# Patient Record
Sex: Female | Born: 1980 | Race: White | Hispanic: No | Marital: Single | Smoking: Current every day smoker
Health system: Southern US, Community
[De-identification: ages and names within clinical notes are randomized; demographics above are authoritative.]

## PROBLEM LIST (undated history)

## (undated) DIAGNOSIS — E282 Polycystic ovarian syndrome: Secondary | ICD-10-CM

---

## 2000-06-29 ENCOUNTER — Emergency Department (HOSPITAL_COMMUNITY): Admission: EM | Admit: 2000-06-29 | Discharge: 2000-06-29 | Payer: Self-pay | Admitting: Emergency Medicine

## 2002-01-02 ENCOUNTER — Inpatient Hospital Stay (HOSPITAL_COMMUNITY): Admission: AD | Admit: 2002-01-02 | Discharge: 2002-01-02 | Payer: Self-pay | Admitting: Obstetrics and Gynecology

## 2002-08-07 ENCOUNTER — Encounter: Payer: Self-pay | Admitting: Emergency Medicine

## 2002-08-07 ENCOUNTER — Emergency Department (HOSPITAL_COMMUNITY): Admission: EM | Admit: 2002-08-07 | Discharge: 2002-08-07 | Payer: Self-pay | Admitting: Emergency Medicine

## 2003-11-25 ENCOUNTER — Encounter: Admission: RE | Admit: 2003-11-25 | Discharge: 2003-11-25 | Payer: Self-pay | Admitting: Family Medicine

## 2008-11-02 ENCOUNTER — Emergency Department (HOSPITAL_COMMUNITY): Admission: EM | Admit: 2008-11-02 | Discharge: 2008-11-02 | Payer: Self-pay | Admitting: Emergency Medicine

## 2009-09-15 ENCOUNTER — Emergency Department (HOSPITAL_COMMUNITY): Admission: EM | Admit: 2009-09-15 | Discharge: 2009-09-15 | Payer: Self-pay | Admitting: Emergency Medicine

## 2009-11-18 ENCOUNTER — Emergency Department (HOSPITAL_COMMUNITY): Admission: EM | Admit: 2009-11-18 | Discharge: 2009-11-18 | Payer: Self-pay | Admitting: Emergency Medicine

## 2010-05-05 ENCOUNTER — Emergency Department: Payer: Self-pay | Admitting: Emergency Medicine

## 2010-07-19 ENCOUNTER — Emergency Department (HOSPITAL_COMMUNITY)
Admission: EM | Admit: 2010-07-19 | Discharge: 2010-07-19 | Disposition: A | Discharge status: Home / Self Care | Payer: Self-pay | Attending: Emergency Medicine | Admitting: Emergency Medicine

## 2010-07-19 DIAGNOSIS — R05 Cough: Secondary | ICD-10-CM | POA: Insufficient documentation

## 2010-07-19 DIAGNOSIS — R059 Cough, unspecified: Secondary | ICD-10-CM | POA: Insufficient documentation

## 2010-07-19 DIAGNOSIS — J069 Acute upper respiratory infection, unspecified: Secondary | ICD-10-CM | POA: Insufficient documentation

## 2010-07-19 DIAGNOSIS — F17200 Nicotine dependence, unspecified, uncomplicated: Secondary | ICD-10-CM | POA: Insufficient documentation

## 2010-07-29 ENCOUNTER — Emergency Department (HOSPITAL_COMMUNITY)
Admission: EM | Admit: 2010-07-29 | Discharge: 2010-07-29 | Disposition: A | Discharge status: Home / Self Care | Payer: Self-pay | Attending: Emergency Medicine | Admitting: Emergency Medicine

## 2010-07-29 DIAGNOSIS — R059 Cough, unspecified: Secondary | ICD-10-CM | POA: Insufficient documentation

## 2010-07-29 DIAGNOSIS — R05 Cough: Secondary | ICD-10-CM | POA: Insufficient documentation

## 2010-07-29 DIAGNOSIS — R5381 Other malaise: Secondary | ICD-10-CM | POA: Insufficient documentation

## 2010-07-29 DIAGNOSIS — R5383 Other fatigue: Secondary | ICD-10-CM | POA: Insufficient documentation

## 2010-08-15 ENCOUNTER — Emergency Department (HOSPITAL_COMMUNITY)
Admission: EM | Admit: 2010-08-15 | Discharge: 2010-08-16 | Disposition: A | Discharge status: Home / Self Care | Payer: Self-pay | Attending: Emergency Medicine | Admitting: Emergency Medicine

## 2010-08-15 DIAGNOSIS — X58XXXA Exposure to other specified factors, initial encounter: Secondary | ICD-10-CM | POA: Insufficient documentation

## 2010-08-15 DIAGNOSIS — S025XXA Fracture of tooth (traumatic), initial encounter for closed fracture: Secondary | ICD-10-CM | POA: Insufficient documentation

## 2010-08-15 DIAGNOSIS — K089 Disorder of teeth and supporting structures, unspecified: Secondary | ICD-10-CM | POA: Insufficient documentation

## 2010-08-15 DIAGNOSIS — K029 Dental caries, unspecified: Secondary | ICD-10-CM | POA: Insufficient documentation

## 2010-09-23 NOTE — Assessment & Plan Note (Unsigned)
NAMELASONIA, CASINO NO.:  [id number]  MEDICAL RECORD NO.:  [id number]           PATIENT TYPE:  LOCATION:  [facility] at [facility]           FACILITY:  PHYSICIAN:  Allie Bossier, MD        DATE OF BIRTH:  1981-05-08  DATE OF SERVICE:  08/24/2010                                 CLINIC NOTE  The patient comes to the office today for a new headache consultation. The patient has been seen at [facility].  She was diagnosed with migraine this past August 2011, though she does remember having headaches in middle school as well as high school.  She does not have aura with her headaches.  She did also complain of some pain in her neck and shoulder and some tingling in her arms.  She has had an MRI and CT of her head and neck, which were negative.  She has approximately four severe headaches per month lasting up to 2 days each, 10-12 moderate headaches per month and 5-6 mild headaches.  She is having approximately 6 days per month without headache.  When she was seen at [facility], she was given steroid injection and Imitrex, and she believes a shot for something for pain.  She saw Dr. Tinnie Gens for an unrelated issue and was put on Topamax.  She has been taking Topamax 50 mg in the morning and 50 mg at night up until 1 week ago.  She had multiple side effects from taking the medication and discontinued approximately 1 week ago.  The patient does have a history of anxiety.  No depression.  No insomnia.  SOCIAL HISTORY:  The patient works in [employer].  She has two children, ages 30 and 30 years old.  Triggers are stress, not eating.  BIRTH CONTROL:  She has had an ablation secondary to prolonged bleeding.  PROCEDURES:  The patient has received an occipital nerve block in her left occipital nerve approximately 3 mL total, please see that procedure note.  PHYSICAL EXAMINATION:  GENERAL:  A well-developed, well-nourished 30- year-old Caucasian female  in no acute distress. HEENT:  Head is normocephalic and atraumatic.  Pupils equal and reactive.  The patient does have quite severe tenderness at her left occipital region. CARDIAC:  Regular rate and rhythm.  No murmur was appreciated. LUNGS:  Clear bilaterally. NEUROLOGIC:  The patient is alert and oriented.  She is well coordinated.  She has good muscle tone.  Good sensation.  She has appropriate affect.  ASSESSMENT: 1. Migraine without aura. 2. Occipital neuralgia.  PLAN:  The patient want to have occipital nerve block into her left occipital nerve.  She did get immediate relief from that.  She is asked to ice that and that should help significantly.  We have also had a lengthy discussion concerning Topamax.  She will restart the Topamax at 25 mg 1 tablet at bedtime as she feels the side effects decrease, she will go up to two, and if she feels the side effects decrease, she will go up to three.  She has been given a prescription for Maxalt as well as Phenergan.  We discussed ways in which to use these  medications.  She is asked to add Aleve 2 tablets to her Maxalt when she gets a headache.  We spent a significant amount of time today on educational issues, and the patient has a better understanding of her disease process.  She will return to this office in 3 weeks or sooner as need be.     Remonia Richter, NP   ______________________________ Allie Bossier, MD   LR/MEDQ  D:  08/24/2010  T:  08/25/2010  Job:  [id number]

## 2010-11-12 ENCOUNTER — Emergency Department (HOSPITAL_COMMUNITY)
Admission: EM | Admit: 2010-11-12 | Discharge: 2010-11-12 | Disposition: A | Discharge status: Home / Self Care | Payer: Self-pay | Attending: Emergency Medicine | Admitting: Emergency Medicine

## 2010-11-12 DIAGNOSIS — K047 Periapical abscess without sinus: Secondary | ICD-10-CM | POA: Insufficient documentation

## 2010-11-12 DIAGNOSIS — K089 Disorder of teeth and supporting structures, unspecified: Secondary | ICD-10-CM | POA: Insufficient documentation

## 2010-11-12 DIAGNOSIS — K029 Dental caries, unspecified: Secondary | ICD-10-CM | POA: Insufficient documentation

## 2011-04-08 ENCOUNTER — Emergency Department (HOSPITAL_COMMUNITY)
Admission: EM | Admit: 2011-04-08 | Discharge: 2011-04-08 | Disposition: A | Discharge status: Home / Self Care | Payer: Self-pay | Attending: Emergency Medicine | Admitting: Emergency Medicine

## 2011-04-08 DIAGNOSIS — J329 Chronic sinusitis, unspecified: Secondary | ICD-10-CM | POA: Insufficient documentation

## 2011-05-05 ENCOUNTER — Encounter: Payer: Self-pay | Admitting: *Deleted

## 2011-05-05 ENCOUNTER — Emergency Department (HOSPITAL_COMMUNITY)
Admission: EM | Admit: 2011-05-05 | Discharge: 2011-05-06 | Disposition: A | Discharge status: Home / Self Care | Payer: Self-pay | Attending: Emergency Medicine | Admitting: Emergency Medicine

## 2011-05-05 DIAGNOSIS — K047 Periapical abscess without sinus: Secondary | ICD-10-CM

## 2011-05-05 DIAGNOSIS — Z79899 Other long term (current) drug therapy: Secondary | ICD-10-CM | POA: Insufficient documentation

## 2011-05-05 DIAGNOSIS — J45909 Unspecified asthma, uncomplicated: Secondary | ICD-10-CM | POA: Insufficient documentation

## 2011-05-05 DIAGNOSIS — K0889 Other specified disorders of teeth and supporting structures: Secondary | ICD-10-CM

## 2011-05-05 DIAGNOSIS — F17200 Nicotine dependence, unspecified, uncomplicated: Secondary | ICD-10-CM | POA: Insufficient documentation

## 2011-05-05 DIAGNOSIS — K029 Dental caries, unspecified: Secondary | ICD-10-CM | POA: Insufficient documentation

## 2011-05-05 DIAGNOSIS — E282 Polycystic ovarian syndrome: Secondary | ICD-10-CM | POA: Insufficient documentation

## 2011-05-05 HISTORY — DX: Polycystic ovarian syndrome: E28.2

## 2011-05-05 NOTE — ED Notes (Signed)
Pt in c/o mouth pain x2 weeks, increased in last 2 days

## 2011-05-06 MED ORDER — HYDROCODONE-ACETAMINOPHEN 5-325 MG PO TABS: 1.0000 | ORAL_TABLET | ORAL | Status: AC | PRN

## 2011-05-06 MED ORDER — CLINDAMYCIN HCL 300 MG PO CAPS
300.0000 mg | ORAL_CAPSULE | Freq: Once | ORAL | Status: AC
  Administered 2011-05-06: 300 mg via ORAL
  Filled 2011-05-06: qty 1

## 2011-05-06 MED ORDER — OXYCODONE-ACETAMINOPHEN 5-325 MG PO TABS
1.0000 | ORAL_TABLET | Freq: Once | ORAL | Status: AC
  Administered 2011-05-06: 1 via ORAL
  Filled 2011-05-06: qty 1

## 2011-05-06 MED ORDER — CLINDAMYCIN HCL 150 MG PO CAPS: 300.0000 mg | ORAL_CAPSULE | Freq: Four times a day (QID) | ORAL | Status: AC

## 2011-05-06 NOTE — ED Provider Notes (Signed)
History     CSN: [id number] Arrival date & time: 05/05/2011 10:06 PM   First MD Initiated Contact with Patient 05/06/11 0029      Chief Complaint  Patient presents with  . Dental Pain   HPI  History provided by the patient. Patient presents with complaints of off-and-on bilateral molar dental pains that seemed to increase over the past 2 days. Pt states she has history of poor dental care and problems with teeth off-and-on and knows that she needs to see a dentist but is unable to pay to see one. Patient has no dental insurance. Pain is worse with eating or pressure over the teeth. Pain is sharp and shooting at times. Patient denies swelling of gum or under the tongue. She denies fever, chills, sweats, nausea or vomiting. Patient has no other significant past medical history.    Past Medical History  Diagnosis Date  . Asthma   . Polycystic disease, ovaries     History reviewed. No pertinent past surgical history.  History reviewed. No pertinent family history.  History  Substance Use Topics  . Smoking status: Current Everyday Smoker  . Smokeless tobacco: Not on file  . Alcohol Use: No    OB History    Grav Para Term Preterm Abortions TAB SAB Ect Mult Living                  Review of Systems  Constitutional: Negative for fever and chills.  HENT: Negative for sore throat and trouble swallowing.   Gastrointestinal: Negative for nausea and vomiting.  All other systems reviewed and are negative.    Allergies  Penicillins cross reactors and Septra  Home Medications   Current Outpatient Rx  Name Route Sig Dispense Refill  . METFORMIN HCL 1000 MG PO TABS Oral Take 1,000 mg by mouth 2 (two) times daily with a meal.      . CLINDAMYCIN HCL 150 MG PO CAPS Oral Take 2 capsules (300 mg total) by mouth every 6 (six) hours. 28 capsule 0  . HYDROCODONE-ACETAMINOPHEN 5-325 MG PO TABS Oral Take 1 tablet by mouth every 4 (four) hours as needed for pain. 20 tablet 0    BP  147/94  Pulse 92  Temp(Src) 98.8 F (37.1 C) (Oral)  Resp 20  SpO2 100%  Physical Exam  Nursing note and vitals reviewed. Constitutional: She is oriented to person, place, and time. She appears well-developed and well-nourished. No distress.  HENT:  Head: Normocephalic and atraumatic.  Mouth/Throat: Oropharynx is clear and moist.       Multiple dental caries with dental decay on right upper and lower molars as well as left lower molars.  Decay extends to the gum line for some teeth. Pain with palpation and percussion of molars.  No swelling under tongue or signs concerning for Ludwig's angina.  No TTP over TMJs.  Neck: Normal range of motion. Neck supple.  Cardiovascular: Normal rate.   No murmur heard. Pulmonary/Chest: Effort normal. She has no wheezes. She has no rales.  Lymphadenopathy:    She has no cervical adenopathy.  Neurological: She is alert and oriented to person, place, and time.  Skin: Skin is warm.  Psychiatric: She has a normal mood and affect.    ED Course  Procedures (including critical care time)  Labs Reviewed - No data to display No results found.   1. Pain, dental   2. Infected dental carries       MDM  Patient seen and evaluated.  Patient no acute distress.     Medical screening examination/treatment/procedure(s) were conducted as a shared visit with non-physician practitioner(s) and myself.  I personally evaluated the patient during the encounter. On exam has very poor dentition but no single area to suggest a dental abscess that can be drained at this time. Symptomatic control and dental referral provided.   Angus Seller, PA 05/06/11 1610  Sunnie Nielsen, MD 05/06/11 [phone]

## 2011-05-25 ENCOUNTER — Emergency Department (HOSPITAL_COMMUNITY)
Admission: EM | Admit: 2011-05-25 | Discharge: 2011-05-25 | Disposition: A | Discharge status: Home / Self Care | Payer: Self-pay | Attending: Emergency Medicine | Admitting: Emergency Medicine

## 2011-05-25 ENCOUNTER — Encounter (HOSPITAL_COMMUNITY): Payer: Self-pay | Admitting: Adult Health

## 2011-05-25 DIAGNOSIS — J45909 Unspecified asthma, uncomplicated: Secondary | ICD-10-CM | POA: Insufficient documentation

## 2011-05-25 DIAGNOSIS — H6690 Otitis media, unspecified, unspecified ear: Secondary | ICD-10-CM | POA: Insufficient documentation

## 2011-05-25 DIAGNOSIS — H6693 Otitis media, unspecified, bilateral: Secondary | ICD-10-CM

## 2011-05-25 DIAGNOSIS — R05 Cough: Secondary | ICD-10-CM | POA: Insufficient documentation

## 2011-05-25 DIAGNOSIS — F17200 Nicotine dependence, unspecified, uncomplicated: Secondary | ICD-10-CM | POA: Insufficient documentation

## 2011-05-25 DIAGNOSIS — H9209 Otalgia, unspecified ear: Secondary | ICD-10-CM | POA: Insufficient documentation

## 2011-05-25 DIAGNOSIS — R059 Cough, unspecified: Secondary | ICD-10-CM | POA: Insufficient documentation

## 2011-05-25 MED ORDER — AZITHROMYCIN 250 MG PO TABS: ORAL_TABLET | ORAL | Status: DC

## 2011-05-25 MED ORDER — ALBUTEROL SULFATE HFA 108 (90 BASE) MCG/ACT IN AERS: 1.0000 | INHALATION_SPRAY | Freq: Four times a day (QID) | RESPIRATORY_TRACT | Status: DC | PRN

## 2011-05-25 MED ORDER — ANTIPYRINE-BENZOCAINE 5.4-1.4 % OT SOLN: 3.0000 [drp] | OTIC | Status: AC | PRN

## 2011-05-25 NOTE — ED Notes (Signed)
C/o of fever runny nose, cough, sore throat for one week. Associated with nausea and vomitting.

## 2011-05-25 NOTE — ED Provider Notes (Signed)
History     CSN: [id number] Arrival date & time: 05/25/2011  9:30 AM   First MD Initiated Contact with Patient 05/25/11 0950      No chief complaint on file.   (Consider location/radiation/quality/duration/timing/severity/associated sxs/prior treatment) Patient is a 30 y.o. female presenting with ear pain.  Otalgia This is a new problem. Episode onset: Patient says she's been sick since last week. Initially she had a fever up to 101. She's had cough and rhinorrhea. Yesterday she developed sore throat and this morning has a right earache. 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 There is pain in the right ear. The problem has been gradually worsening. The maximum temperature recorded prior to her arrival was 101 to 101.9 F. The pain is mild. Associated symptoms include sore throat and cough.    Past Medical History  Diagnosis Date  . Asthma   . Polycystic disease, ovaries     History reviewed. No pertinent past surgical history.  History reviewed. No pertinent family history.  History  Substance Use Topics  . Smoking status: Current Everyday Smoker  . Smokeless tobacco: Not on file  . Alcohol Use: No    OB History    Grav Para Term Preterm Abortions TAB SAB Ect Mult Living                  Review of Systems  Constitutional: Positive for fever.  HENT: Positive for ear pain and sore throat.   Respiratory: Positive for cough.   Cardiovascular: Negative.   Gastrointestinal: Negative.   Genitourinary: Negative.   Musculoskeletal: Negative.   Skin: Negative.   Neurological: Negative.   Psychiatric/Behavioral: Negative.     Allergies  Amoxicillin; Penicillins cross reactors; and Septra  Home Medications   Current Outpatient Rx  Name Route Sig Dispense Refill  . METFORMIN HCL 1000 MG PO TABS Oral Take 1,000 mg by mouth 2 (two) times daily with a meal.      . NORGESTIMATE-ETH ESTRADIOL 0.25-35 MG-MCG PO TABS Oral Take 1 tablet by mouth  daily.        BP 109/75  Pulse 93  Temp(Src) 98.3 F (36.8 C) (Oral)  Resp 18  SpO2 97%  Physical Exam  Constitutional: She is oriented to person, place, and time. She appears well-developed and well-nourished. No distress.  HENT:  Head: Normocephalic and atraumatic.  Mouth/Throat: Oropharynx is clear and moist.       Both tympanic membranes are red and retracted.  Eyes: Conjunctivae and EOM are normal. Pupils are equal, round, and reactive to light.  Neck: Normal range of motion. Neck supple.  Cardiovascular: Normal rate, regular rhythm and normal heart sounds.   Pulmonary/Chest: Effort normal. She has wheezes.  Abdominal: Soft. Bowel sounds are normal.  Musculoskeletal: Normal range of motion.  Lymphadenopathy:    She has no cervical adenopathy.  Neurological: She is alert and oriented to person, place, and time.       No sensory or motor deficit  Skin: Skin is warm and dry.  Psychiatric: She has a normal mood and affect. Her behavior is normal.    ED Course  Procedures (including critical care time)  10:07 AM Pt has bilateral otitis media, asthma. Rx Z-Pak, Auralgan ear drops, Albuterol inhaler.   1. Bilateral otitis media   2. Asthma             Carleene Cooper III, MD 05/25/11 1011

## 2011-07-20 ENCOUNTER — Encounter (HOSPITAL_COMMUNITY): Payer: Self-pay | Admitting: *Deleted

## 2011-07-20 ENCOUNTER — Emergency Department (HOSPITAL_COMMUNITY)
Admission: EM | Admit: 2011-07-20 | Discharge: 2011-07-20 | Disposition: A | Discharge status: Home / Self Care | Payer: Self-pay | Attending: Emergency Medicine | Admitting: Emergency Medicine

## 2011-07-20 DIAGNOSIS — Z79899 Other long term (current) drug therapy: Secondary | ICD-10-CM | POA: Insufficient documentation

## 2011-07-20 DIAGNOSIS — K047 Periapical abscess without sinus: Secondary | ICD-10-CM | POA: Insufficient documentation

## 2011-07-20 DIAGNOSIS — J45909 Unspecified asthma, uncomplicated: Secondary | ICD-10-CM | POA: Insufficient documentation

## 2011-07-20 DIAGNOSIS — K029 Dental caries, unspecified: Secondary | ICD-10-CM | POA: Insufficient documentation

## 2011-07-20 MED ORDER — CLINDAMYCIN HCL 150 MG PO CAPS: 150.0000 mg | ORAL_CAPSULE | Freq: Four times a day (QID) | ORAL | Status: AC

## 2011-07-20 MED ORDER — IBUPROFEN 600 MG PO TABS: 600.0000 mg | ORAL_TABLET | Freq: Four times a day (QID) | ORAL | Status: AC | PRN

## 2011-07-20 MED ORDER — CLINDAMYCIN HCL 300 MG PO CAPS
300.0000 mg | ORAL_CAPSULE | Freq: Once | ORAL | Status: AC
  Administered 2011-07-20: 300 mg via ORAL
  Filled 2011-07-20: qty 1

## 2011-07-20 NOTE — ED Provider Notes (Signed)
History     CSN: [id number]  Arrival date & time 07/20/11  9147   First MD Initiated Contact with Patient 07/20/11 1844      Chief Complaint  Patient presents with  . Dental Pain    (Consider location/radiation/quality/duration/timing/severity/associated sxs/prior treatment) HPI  31yo F presents with dental pain.  Sts for the past several month she has been having pain to her left side of mouth.  Pain to her teeth and is on and off.  Pain worsen with eating, chewing.  She is aware that she has bad dentition and has been to ER for same complaints in the past.  She is unable to afford dental care due to lost of job and can't follow up with dentist.  Currently denies fever, n/v/d, neck pain, ear pain, or abd pain.  Denies recent rec drug use.    Past Medical History  Diagnosis Date  . Asthma   . Polycystic disease, ovaries     History reviewed. No pertinent past surgical history.  No family history on file.  History  Substance Use Topics  . Smoking status: Current Everyday Smoker  . Smokeless tobacco: Not on file  . Alcohol Use: No    OB History    Grav Para Term Preterm Abortions TAB SAB Ect Mult Living                  Review of Systems  All other systems reviewed and are negative.    Allergies  Amoxicillin; Penicillins cross reactors; and Septra  Home Medications   Current Outpatient Rx  Name Route Sig Dispense Refill  . ALBUTEROL SULFATE HFA 108 (90 BASE) MCG/ACT IN AERS Inhalation Inhale 1-2 puffs into the lungs every 6 (six) hours as needed for wheezing. 1 Inhaler 0  . AZITHROMYCIN 250 MG PO TABS  Take 2 tablets today, then 1 every day until finished. 6 tablet 0  . VITAMIN B-12 PO Oral Take 1 tablet by mouth daily.      . DROSPIRENONE-ETHINYL ESTRADIOL 3-0.03 MG PO TABS Oral Take 1 tablet by mouth daily. PT TAKES WITH SPRINTEC     . METFORMIN HCL 1000 MG PO TABS Oral Take 1,000 mg by mouth 2 (two) times daily with a meal.      . MULTI-VITAMIN/MINERALS PO  TABS Oral Take 1 tablet by mouth daily.      . SPRINTEC 28 PO Oral Take 1 tablet by mouth daily. PT TAKES WITH SYEDA     . VITAMIN C 500 MG PO TABS Oral Take 500 mg by mouth daily.        BP 149/97  Pulse 92  Temp(Src) 98.5 F (36.9 C) (Oral)  Resp 16  Ht 5\' 8"  (1.727 m)  SpO2 100%  LMP 07/15/2011  Physical Exam  Nursing note and vitals reviewed. Constitutional: She appears well-developed and well-nourished. No distress.  HENT:  Head: Normocephalic and atraumatic.  Right Ear: External ear normal.  Left Ear: External ear normal.  Mouth/Throat:           Multiple dental caries with dental decay on right upper and lower molars as well as left lower molars.  Decay extends to the gum line for some teeth. Pain with palpation and percussion of molars.  No swelling under tongue or signs concerning for Ludwig's angina.  No tenderness to palpation over TMJs.  Cardiovascular: Regular rhythm.   Pulmonary/Chest: Effort normal.  Neurological: She is alert.    ED Course  Procedures (including critical care  time)  Labs Reviewed - No data to display No results found.   No diagnosis found.    MDM  Repeat visits for dental complaints.  Obvious dental decays noted.  Will prescribe clindamycin and f/u with dentist.          Fayrene Helper, PA-C 07/20/11 1918

## 2011-07-20 NOTE — ED Provider Notes (Signed)
Medical screening examination/treatment/procedure(s) were performed by non-physician practitioner and as supervising physician I was immediately available for consultation/collaboration.   Dayton Bailiff, MD 07/20/11 [phone]

## 2011-07-20 NOTE — ED Notes (Signed)
Pt reports pain to left side of mouth x 1 month. Taken ibuprofen at home with some relief. States lost job and unable to see dentist.

## 2011-12-12 ENCOUNTER — Encounter (HOSPITAL_COMMUNITY): Payer: Self-pay | Admitting: *Deleted

## 2011-12-12 ENCOUNTER — Emergency Department (HOSPITAL_COMMUNITY)
Admission: EM | Admit: 2011-12-12 | Discharge: 2011-12-12 | Disposition: A | Discharge status: Home / Self Care | Payer: Self-pay | Attending: Emergency Medicine | Admitting: Emergency Medicine

## 2011-12-12 DIAGNOSIS — Z79899 Other long term (current) drug therapy: Secondary | ICD-10-CM | POA: Insufficient documentation

## 2011-12-12 DIAGNOSIS — R0982 Postnasal drip: Secondary | ICD-10-CM

## 2011-12-12 DIAGNOSIS — J069 Acute upper respiratory infection, unspecified: Secondary | ICD-10-CM | POA: Insufficient documentation

## 2011-12-12 DIAGNOSIS — J45909 Unspecified asthma, uncomplicated: Secondary | ICD-10-CM | POA: Insufficient documentation

## 2011-12-12 LAB — RAPID STREP SCREEN (MED CTR MEBANE ONLY): Streptococcus, Group A Screen (Direct): NEGATIVE

## 2011-12-12 MED ORDER — BENZONATATE 100 MG PO CAPS: 100.0000 mg | ORAL_CAPSULE | Freq: Three times a day (TID) | ORAL | Status: AC

## 2011-12-12 MED ORDER — FLUTICASONE PROPIONATE 50 MCG/ACT NA SUSP: 2.0000 | Freq: Every day | NASAL | Status: DC

## 2011-12-12 NOTE — ED Provider Notes (Signed)
History     CSN: [id number]  Arrival date & time 12/12/11  1947   First MD Initiated Contact with Patient 12/12/11 2103      Chief Complaint  Patient presents with  . Sore Throat    (Consider location/radiation/quality/duration/timing/severity/associated sxs/prior treatment) Patient is a 31 y.o. female presenting with cough. The history is provided by the patient.  Cough The current episode started more than 2 days ago. The problem occurs constantly. The problem has not changed since onset.There has been no fever. Pertinent negatives include no shortness of breath and no wheezing.   31 y/o female iNAD c/o runny nose,positional cough and "itching" throat with bilateral ear pressure x5 days. Pt is coughing up phlegm much worse in AM. Denies fever, SOB, N/V or any GI complaints.    Past Medical History  Diagnosis Date  . Asthma   . Polycystic disease, ovaries     History reviewed. No pertinent past surgical history.  No family history on file.  History  Substance Use Topics  . Smoking status: Current Everyday Smoker  . Smokeless tobacco: Not on file  . Alcohol Use: No    OB History    Grav Para Term Preterm Abortions TAB SAB Ect Mult Living                  Review of Systems  Constitutional: Positive for fatigue. Negative for fever.  Respiratory: Positive for cough. Negative for shortness of breath, wheezing and stridor.   Gastrointestinal: Negative for nausea, vomiting, diarrhea and constipation.  All other systems reviewed and are negative.    Allergies  Amoxicillin; Penicillins cross reactors; and Septra  Home Medications   Current Outpatient Rx  Name Route Sig Dispense Refill  . ALBUTEROL SULFATE HFA 108 (90 BASE) MCG/ACT IN AERS Inhalation Inhale 1-2 puffs into the lungs every 6 (six) hours as needed for wheezing. 1 Inhaler 0  . DROSPIRENONE-ETHINYL ESTRADIOL 3-0.03 MG PO TABS Oral Take 1 tablet by mouth daily. PT TAKES WITH SPRINTEC    . GUAIFENESIN ER  600 MG PO TB12 Oral Take 600 mg by mouth 2 (two) times daily as needed. For cough.    . METFORMIN HCL 1000 MG PO TABS Oral Take 1,000 mg by mouth daily.     . MULTI-VITAMIN/MINERALS PO TABS Oral Take 1 tablet by mouth daily.      . SPRINTEC 28 PO Oral Take 1 tablet by mouth daily. PT TAKES WITH SYEDA     . CYANOCOBALAMIN 250 MCG PO TABS Oral Take 500 mcg by mouth daily.    Marland Kitchen VITAMIN C 500 MG PO TABS Oral Take 500 mg by mouth daily.        BP 128/81  Pulse 83  Temp 98.9 F (37.2 C) (Oral)  Resp 24  SpO2 95%  LMP 12/11/2011  Physical Exam  Nursing note and vitals reviewed. Constitutional: She is oriented to person, place, and time. She appears well-developed and well-nourished. No distress.       Obese   HENT:  Head: Normocephalic and atraumatic.  Right Ear: External ear normal.  Left Ear: External ear normal.  Nose: Nose normal.  Mouth/Throat: Oropharynx is clear and moist. No oropharyngeal exudate.       Posterior pharynx injected   Eyes: Conjunctivae and EOM are normal. Pupils are equal, round, and reactive to light.  Neck: Normal range of motion.       Mild shotty AC LAD  Cardiovascular: Normal rate.   Pulmonary/Chest: Effort normal  and breath sounds normal. No respiratory distress. She has no wheezes. She has no rales.  Abdominal: Soft. Bowel sounds are normal.  Musculoskeletal: Normal range of motion.  Neurological: She is alert and oriented to person, place, and time.  Psychiatric: She has a normal mood and affect.    ED Course  Procedures (including critical care time)   Labs Reviewed  RAPID STREP SCREEN   No results found.   1. URI (upper respiratory infection)   2. Post-nasal drip       MDM  31 y/o with s/s consistent with URI with Eustation tube dysfunction and post nasal drip. Advised Pt antibiotics will not help her to get will faster. Rx fluticasone with nasal saline OTC and tessalon cough Rx.         Wynetta Emery, PA-C 12/12/11 2213

## 2011-12-12 NOTE — ED Notes (Signed)
Pt c/o cough; yellow drainage; bilateral ear pain; sore throat; symptoms x 5 days

## 2011-12-12 NOTE — Discharge Instructions (Signed)
Use nasal saline at least 4 times a day, use saline 5-10 minutes before using the fluticasone (flonase)  Viral Infections A virus is a type of germ. Viruses can cause:  Minor sore throats.   Aches and pains.   Headaches.   Runny nose.   Rashes.   Watery eyes.   Tiredness.   Coughs.   Loss of appetite.   Feeling sick to your stomach (nausea).   Throwing up (vomiting).   Watery poop (diarrhea).  HOME CARE   Only take medicines as told by your doctor.   Drink enough water and fluids to keep your pee (urine) clear or pale yellow. Sports drinks are a good choice.   Get plenty of rest and eat healthy. Soups and broths with crackers or rice are fine.  GET HELP RIGHT AWAY IF:   You have a very bad headache.   You have shortness of breath.   You have chest pain or neck pain.   You have an unusual rash.   You cannot stop throwing up.   You have watery poop that does not stop.   You cannot keep fluids down.   You or your child has a temperature by mouth above 102 F (38.9 C), not controlled by medicine.   Your baby is older than 3 months with a rectal temperature of 102 F (38.9 C) or higher.   Your baby is 81 months old or younger with a rectal temperature of 100.4 F (38 C) or higher.  MAKE SURE YOU:   Understand these instructions.   Will watch this condition.   Will get help right away if you are not doing well or get worse.  Document Released: 05/12/2008 Document Revised: 05/19/2011 Document Reviewed: 10/05/2010 [facility] Patient Information 2012 [place], [place].

## 2011-12-13 LAB — STREP A DNA PROBE: Group A Strep Probe: NEGATIVE

## 2011-12-14 NOTE — ED Provider Notes (Signed)
Medical screening examination/treatment/procedure(s) were performed by non-physician practitioner and as supervising physician I was immediately available for consultation/collaboration.   Aisa Schoeppner E Lynore Coscia, MD 12/14/11 0806 

## 2011-12-20 ENCOUNTER — Encounter (HOSPITAL_COMMUNITY): Payer: Self-pay | Admitting: Cardiology

## 2011-12-20 ENCOUNTER — Emergency Department (HOSPITAL_COMMUNITY)
Admission: EM | Admit: 2011-12-20 | Discharge: 2011-12-20 | Disposition: A | Discharge status: Home / Self Care | Payer: Self-pay | Attending: Emergency Medicine | Admitting: Emergency Medicine

## 2011-12-20 DIAGNOSIS — J45909 Unspecified asthma, uncomplicated: Secondary | ICD-10-CM | POA: Insufficient documentation

## 2011-12-20 DIAGNOSIS — K089 Disorder of teeth and supporting structures, unspecified: Secondary | ICD-10-CM | POA: Insufficient documentation

## 2011-12-20 DIAGNOSIS — K0889 Other specified disorders of teeth and supporting structures: Secondary | ICD-10-CM

## 2011-12-20 MED ORDER — CLINDAMYCIN HCL 300 MG PO CAPS
300.0000 mg | ORAL_CAPSULE | Freq: Once | ORAL | Status: AC
  Administered 2011-12-20: 300 mg via ORAL
  Filled 2011-12-20 (×2): qty 1

## 2011-12-20 MED ORDER — HYDROCODONE-ACETAMINOPHEN 5-325 MG PO TABS
1.0000 | ORAL_TABLET | Freq: Once | ORAL | Status: AC
  Administered 2011-12-20: 1 via ORAL
  Filled 2011-12-20: qty 1

## 2011-12-20 MED ORDER — HYDROCODONE-ACETAMINOPHEN 5-325 MG PO TABS: 1.0000 | ORAL_TABLET | ORAL | Status: AC | PRN

## 2011-12-20 MED ORDER — CLINDAMYCIN HCL 150 MG PO CAPS: 300.0000 mg | ORAL_CAPSULE | Freq: Three times a day (TID) | ORAL | Status: AC

## 2011-12-20 NOTE — ED Provider Notes (Signed)
History     CSN: [id number]  Arrival date & time 12/20/11  4098   First MD Initiated Contact with Patient 12/20/11 0848      Chief Complaint  Patient presents with  . Dental Pain    (Consider location/radiation/quality/duration/timing/severity/associated sxs/prior treatment) Patient is a 31 y.o. female presenting with tooth pain. The history is provided by the patient.  Dental PainThe primary symptoms include mouth pain. Primary symptoms do not include fever. The symptoms began yesterday. The symptoms are worsening. The symptoms occur constantly.  Additional symptoms do not include: ear pain. Associated symptoms comments: Pain in upper left molar since last night, without injury or known acute fracture. No facial swelling or fever. "I know the tooth is bad". .    Past Medical History  Diagnosis Date  . Asthma   . Polycystic disease, ovaries     History reviewed. No pertinent past surgical history.  History reviewed. No pertinent family history.  History  Substance Use Topics  . Smoking status: Current Everyday Smoker  . Smokeless tobacco: Not on file  . Alcohol Use: No    OB History    Grav Para Term Preterm Abortions TAB SAB Ect Mult Living                  Review of Systems  Constitutional: Negative for fever.  HENT: Positive for dental problem. Negative for ear pain and neck pain.     Allergies  Amoxicillin; Penicillins cross reactors; and Septra  Home Medications   Current Outpatient Rx  Name Route Sig Dispense Refill  . ALBUTEROL SULFATE HFA 108 (90 BASE) MCG/ACT IN AERS Inhalation Inhale 1-2 puffs into the lungs every 6 (six) hours as needed for wheezing. 1 Inhaler 0  . DROSPIRENONE-ETHINYL ESTRADIOL 3-0.03 MG PO TABS Oral Take 1 tablet by mouth daily. PT TAKES WITH SPRINTEC    . FLUTICASONE PROPIONATE 50 MCG/ACT NA SUSP Nasal Place 2 sprays into the nose daily. 16 g 2  . METFORMIN HCL 1000 MG PO TABS Oral Take 1,000 mg by mouth daily.     . SPRINTEC 28  PO Oral Take 1 tablet by mouth daily. PT TAKES WITH SYEDA     . BENZONATATE 100 MG PO CAPS Oral Take 1 capsule (100 mg total) by mouth every 8 (eight) hours. 21 capsule 0    BP 142/86  Pulse 74  Temp 98.2 F (36.8 C) (Oral)  Resp 16  SpO2 100%  LMP 12/11/2011  Physical Exam  Constitutional: She appears well-developed and well-nourished. No distress.  HENT:  Head: Normocephalic.       Generally good dentition. Upper left 1st molar has moderate cavity without surrounding gingival swelling. No facial swelling or local lymphadenopathy.  Neck: Normal range of motion.  Pulmonary/Chest: Effort normal.  Lymphadenopathy:    She has no cervical adenopathy.    ED Course  Procedures (including critical care time)  Labs Reviewed - No data to display No results found.   No diagnosis found.  1. Dental pain   MDM  Cavity that is acutely symptomatic. Will cover with abx and give pain medication. Encourage dental follow up.        Rodena Medin, PA-C 12/20/11 [phone]

## 2011-12-20 NOTE — ED Notes (Signed)
Pt c/o tooth pain to the left side of her face. Reports that the pain started last night.

## 2011-12-21 NOTE — ED Provider Notes (Signed)
Medical screening examination/treatment/procedure(s) were performed by non-physician practitioner and as supervising physician I was immediately available for consultation/collaboration.   Andersen Mckiver, MD 12/21/11 2350 

## 2012-06-19 ENCOUNTER — Emergency Department: Payer: Self-pay | Admitting: Emergency Medicine

## 2012-09-12 ENCOUNTER — Encounter (HOSPITAL_COMMUNITY): Payer: Self-pay

## 2012-09-12 ENCOUNTER — Emergency Department (HOSPITAL_COMMUNITY)
Admission: EM | Admit: 2012-09-12 | Discharge: 2012-09-13 | Disposition: A | Discharge status: Home / Self Care | Payer: Self-pay | Attending: Emergency Medicine | Admitting: Emergency Medicine

## 2012-09-12 DIAGNOSIS — Z79899 Other long term (current) drug therapy: Secondary | ICD-10-CM | POA: Insufficient documentation

## 2012-09-12 DIAGNOSIS — N949 Unspecified condition associated with female genital organs and menstrual cycle: Secondary | ICD-10-CM | POA: Insufficient documentation

## 2012-09-12 DIAGNOSIS — E282 Polycystic ovarian syndrome: Secondary | ICD-10-CM | POA: Insufficient documentation

## 2012-09-12 DIAGNOSIS — Z8742 Personal history of other diseases of the female genital tract: Secondary | ICD-10-CM | POA: Insufficient documentation

## 2012-09-12 DIAGNOSIS — N938 Other specified abnormal uterine and vaginal bleeding: Secondary | ICD-10-CM | POA: Insufficient documentation

## 2012-09-12 DIAGNOSIS — Z3202 Encounter for pregnancy test, result negative: Secondary | ICD-10-CM | POA: Insufficient documentation

## 2012-09-12 DIAGNOSIS — J45909 Unspecified asthma, uncomplicated: Secondary | ICD-10-CM | POA: Insufficient documentation

## 2012-09-12 DIAGNOSIS — N898 Other specified noninflammatory disorders of vagina: Secondary | ICD-10-CM | POA: Insufficient documentation

## 2012-09-12 DIAGNOSIS — F17200 Nicotine dependence, unspecified, uncomplicated: Secondary | ICD-10-CM | POA: Insufficient documentation

## 2012-09-12 LAB — URINALYSIS, ROUTINE W REFLEX MICROSCOPIC
Bilirubin Urine: NEGATIVE
Ketones, ur: NEGATIVE mg/dL
Nitrite: NEGATIVE
Protein, ur: NEGATIVE mg/dL
Specific Gravity, Urine: 1.019 (ref 1.005–1.030)
Urobilinogen, UA: 0.2 mg/dL (ref 0.0–1.0)

## 2012-09-12 LAB — URINE MICROSCOPIC-ADD ON

## 2012-09-12 NOTE — ED Notes (Signed)
Pt states that she's been spotting, brown clotting, for one week, she has another week before her menstrual cycle

## 2012-09-12 NOTE — ED Provider Notes (Signed)
History     CSN: [id number]  Arrival date & time 09/12/12  2059   First MD Initiated Contact with Patient 09/12/12 2244      Chief Complaint  Patient presents with  . Vaginal Bleeding    (Consider location/radiation/quality/duration/timing/severity/associated sxs/prior treatment) HPI Comments: Patient presents emergency department with chief complaint of vaginal bleeding. She states that she is currently taking the pill, and is not do to have her cycle for another week. She states that she has been having mild spotting, with brown spotting for one week. She has been using pads, but has not been filling multiple pads.  She states that she has not had a lot of discharge. She denies having any pain. Nothing makes her symptoms better or worse.  The history is provided by the patient. No language interpreter was used.    Past Medical History  Diagnosis Date  . Asthma   . Polycystic disease, ovaries     History reviewed. No pertinent past surgical history.  History reviewed. No pertinent family history.  History  Substance Use Topics  . Smoking status: Current Every Day Smoker  . Smokeless tobacco: Not on file  . Alcohol Use: No    OB History   Grav Para Term Preterm Abortions TAB SAB Ect Mult Living                  Review of Systems  All other systems reviewed and are negative.    Allergies  Amoxicillin; Penicillins cross reactors; and Septra  Home Medications   Current Outpatient Rx  Name  Route  Sig  Dispense  Refill  . Biotin 2.5 MG CAPS   Oral   Take 2.5 mg by mouth every evening.         . loratadine (CLARITIN) 10 MG tablet   Oral   Take 10 mg by mouth every evening.         . metFORMIN (GLUCOPHAGE) 1000 MG tablet   Oral   Take 1,000 mg by mouth every evening.          Suzzanne Cloud Estradiol (SPRINTEC 28 PO)   Oral   Take 1 tablet by mouth daily.          Marland Kitchen spironolactone (ALDACTONE) 100 MG tablet   Oral   Take 100 mg by mouth  every evening.            BP 134/92  Pulse 90  Temp(Src) 98.2 F (36.8 C) (Oral)  Resp 18  SpO2 100%  LMP 08/24/2012  Physical Exam  Nursing note and vitals reviewed. Constitutional: She is oriented to person, place, and time. She appears well-developed and well-nourished.  HENT:  Head: Normocephalic and atraumatic.  Eyes: Conjunctivae and EOM are normal.  Neck: Normal range of motion. Neck supple.  Cardiovascular: Normal rate and regular rhythm.  Exam reveals no gallop and no friction rub.   No murmur heard. Pulmonary/Chest: Effort normal and breath sounds normal. No respiratory distress. She has no wheezes. She has no rales. She exhibits no tenderness.  Abdominal: Soft. Bowel sounds are normal. She exhibits no distension and no mass. There is no tenderness. There is no rebound and no guarding.  Genitourinary: No labial fusion. There is no rash, tenderness, lesion or injury on the right labia. There is no rash, tenderness, lesion or injury on the left labia. Cervix exhibits discharge. Cervix exhibits no motion tenderness and no friability. Right adnexum displays no mass, no tenderness and no fullness. Left adnexum  displays no mass, no tenderness and no fullness. No erythema, tenderness or bleeding around the vagina. No foreign body around the vagina. No signs of injury around the vagina. No vaginal discharge found.  Very mild red/brown discharge from cervix, no active hemorrhage.  Musculoskeletal: Normal range of motion. She exhibits no edema and no tenderness.  Neurological: She is alert and oriented to person, place, and time.  Skin: Skin is warm and dry.  Psychiatric: She has a normal mood and affect. Her behavior is normal. Judgment and thought content normal.    ED Course  Procedures (including critical care time)  Labs Reviewed  URINALYSIS, ROUTINE W REFLEX MICROSCOPIC - Abnormal; Notable for the following:    Hgb urine dipstick MODERATE (*)    All other components  within normal limits  WET PREP, GENITAL  GC/CHLAMYDIA PROBE AMP  PREGNANCY, URINE  URINE MICROSCOPIC-ADD ON   Results for orders placed during the hospital encounter of 09/12/12  WET PREP, GENITAL      Result Value Range   Yeast Wet Prep HPF POC NONE SEEN  NONE SEEN   Trich, Wet Prep NONE SEEN  NONE SEEN   Clue Cells Wet Prep HPF POC NONE SEEN  NONE SEEN   WBC, Wet Prep HPF POC FEW (*) NONE SEEN  URINALYSIS, ROUTINE W REFLEX MICROSCOPIC      Result Value Range   Color, Urine YELLOW  YELLOW   APPearance CLEAR  CLEAR   Specific Gravity, Urine 1.019  1.005 - 1.030   pH 5.5  5.0 - 8.0   Glucose, UA NEGATIVE  NEGATIVE mg/dL   Hgb urine dipstick MODERATE (*) NEGATIVE   Bilirubin Urine NEGATIVE  NEGATIVE   Ketones, ur NEGATIVE  NEGATIVE mg/dL   Protein, ur NEGATIVE  NEGATIVE mg/dL   Urobilinogen, UA 0.2  0.0 - 1.0 mg/dL   Nitrite NEGATIVE  NEGATIVE   Leukocytes, UA NEGATIVE  NEGATIVE  PREGNANCY, URINE      Result Value Range   Preg Test, Ur NEGATIVE  NEGATIVE  URINE MICROSCOPIC-ADD ON      Result Value Range   Squamous Epithelial / LPF RARE  RARE   WBC, UA 0-2  <3 WBC/hpf   RBC / HPF 0-2  <3 RBC/hpf   Urine-Other RARE YEAST    POCT PREGNANCY, URINE      Result Value Range   Preg Test, Ur NEGATIVE  NEGATIVE  POCT I-STAT, CHEM 8      Result Value Range   Sodium 140  135 - 145 mEq/L   Potassium 4.0  3.5 - 5.1 mEq/L   Chloride 107  96 - 112 mEq/L   BUN 11  6 - 23 mg/dL   Creatinine, Ser 4.09  0.50 - 1.10 mg/dL   Glucose, Bld 92  70 - 99 mg/dL   Calcium, Ion 8.11  9.14 - 1.23 mmol/L   TCO2 25  0 - 100 mmol/L   Hemoglobin 14.6  12.0 - 15.0 g/dL   HCT 78.2  95.6 - 21.3 %       1. Dysfunctional uterine bleeding       MDM  Patient with abnormal uterine bleeding. There is no hemorrhaging. Her H&H are stable. She is not dizzy or lightheaded. She is not pregnant. Patient may be discharged, and followup with OB/GYN. She is stable and ready for  discharge.        Roxy Horseman, PA-C 09/13/12 [phone]

## 2012-09-13 LAB — POCT I-STAT, CHEM 8
Calcium, Ion: 1.18 mmol/L (ref 1.12–1.23)
Creatinine, Ser: 0.8 mg/dL (ref 0.50–1.10)
Glucose, Bld: 92 mg/dL (ref 70–99)
Hemoglobin: 14.6 g/dL (ref 12.0–15.0)
TCO2: 25 mmol/L (ref 0–100)

## 2012-09-13 LAB — WET PREP, GENITAL: Yeast Wet Prep HPF POC: NONE SEEN

## 2012-09-13 LAB — GC/CHLAMYDIA PROBE AMP
CT Probe RNA: NEGATIVE
GC Probe RNA: NEGATIVE

## 2012-09-13 NOTE — ED Provider Notes (Signed)
Medical screening examination/treatment/procedure(s) were performed by non-physician practitioner and as supervising physician I was immediately available for consultation/collaboration.  Lyanne Co, MD 09/13/12 0730

## 2014-02-14 ENCOUNTER — Emergency Department: Payer: Self-pay | Admitting: Emergency Medicine

## 2014-02-16 ENCOUNTER — Emergency Department: Payer: Self-pay | Admitting: Emergency Medicine

## 2014-04-07 ENCOUNTER — Emergency Department (HOSPITAL_COMMUNITY)
Admission: EM | Admit: 2014-04-07 | Discharge: 2014-04-07 | Disposition: A | Discharge status: Home / Self Care | Payer: Self-pay | Attending: Emergency Medicine | Admitting: Emergency Medicine

## 2014-04-07 ENCOUNTER — Encounter (HOSPITAL_COMMUNITY): Payer: Self-pay | Admitting: Emergency Medicine

## 2014-04-07 DIAGNOSIS — J45909 Unspecified asthma, uncomplicated: Secondary | ICD-10-CM | POA: Insufficient documentation

## 2014-04-07 DIAGNOSIS — Z720 Tobacco use: Secondary | ICD-10-CM | POA: Insufficient documentation

## 2014-04-07 DIAGNOSIS — Z79899 Other long term (current) drug therapy: Secondary | ICD-10-CM | POA: Insufficient documentation

## 2014-04-07 DIAGNOSIS — K029 Dental caries, unspecified: Secondary | ICD-10-CM | POA: Insufficient documentation

## 2014-04-07 DIAGNOSIS — Z8639 Personal history of other endocrine, nutritional and metabolic disease: Secondary | ICD-10-CM | POA: Insufficient documentation

## 2014-04-07 DIAGNOSIS — Z880 Allergy status to penicillin: Secondary | ICD-10-CM | POA: Insufficient documentation

## 2014-04-07 DIAGNOSIS — K0889 Other specified disorders of teeth and supporting structures: Secondary | ICD-10-CM

## 2014-04-07 DIAGNOSIS — K088 Other specified disorders of teeth and supporting structures: Secondary | ICD-10-CM | POA: Insufficient documentation

## 2014-04-07 MED ORDER — HYDROCODONE-ACETAMINOPHEN 5-325 MG PO TABS
1.0000 | ORAL_TABLET | Freq: Once | ORAL | Status: AC
  Administered 2014-04-07: 1 via ORAL
  Filled 2014-04-07: qty 1

## 2014-04-07 MED ORDER — NAPROXEN 500 MG PO TABS: 500.0000 mg | ORAL_TABLET | Freq: Two times a day (BID) | ORAL | Status: DC

## 2014-04-07 MED ORDER — HYDROCODONE-ACETAMINOPHEN 5-325 MG PO TABS: ORAL_TABLET | ORAL | Status: DC

## 2014-04-07 MED ORDER — CLINDAMYCIN HCL 150 MG PO CAPS: 300.0000 mg | ORAL_CAPSULE | Freq: Four times a day (QID) | ORAL | Status: DC

## 2014-04-07 NOTE — Discharge Instructions (Signed)
Please read and follow all provided instructions.  Your diagnoses today include:  1. Pain, dental    The exam and treatment you received today has been provided on an emergency basis only. This is not a substitute for complete medical or dental care.  Tests performed today include:  Vital signs. See below for your results today.   Medications prescribed:   Clindamycin - antibiotic  You have been prescribed an antibiotic medicine: take the entire course of medicine even if you are feeling better. Stopping early can cause the antibiotic not to work.   Vicodin (hydrocodone/acetaminophen) - narcotic pain medication  DO NOT drive or perform any activities that require you to be awake and alert because this medicine can make you drowsy. BE VERY CAREFUL not to take multiple medicines containing Tylenol (also called acetaminophen). Doing so can lead to an overdose which can damage your liver and cause liver failure and possibly death.   Naproxen - anti-inflammatory pain medication  Do not exceed 500mg  naproxen every 12 hours, take with food  You have been prescribed an anti-inflammatory medication or NSAID. Take with food. Take smallest effective dose for the shortest duration needed for your pain. Stop taking if you experience stomach pain or vomiting.   Take any prescribed medications only as directed.  Home care instructions:  Follow any educational materials contained in this packet.  Follow-up instructions: Please follow-up with your dentist for further evaluation of your symptoms.   Dental Assistance: See below for dental referrals  Return instructions:   Please return to the Emergency Department if you experience worsening symptoms.  Please return if you develop a fever, you develop more swelling in your face or neck, you have trouble breathing or swallowing food.  Please return if you have any other emergent concerns.  Additional Information:  Your vital signs today  were: BP 142/82   Pulse 77   Temp(Src) 98.2 F (36.8 C) (Oral)   SpO2 95%   LMP 03/13/2014 If your blood pressure (BP) was elevated above 135/85 this visit, please have this repeated by your doctor within one month. -------------- Dental Care: Organization         Address  Phone  Notes  [facility] [address], [place] [phone] Accepts children up to age 33 who are enrolled in [place] or [place] Health Choice; pregnant women with a Medicaid card; and children who have applied for Medicaid or [place] Health Choice, but were declined, whose parents can pay a reduced fee at time of service.  [facility] [place]  [address], [place] [phone] Accepts children up to age 33 who are enrolled in [place] or [place] Health Choice; pregnant women with a Medicaid card; and children who have applied for Medicaid or [place] Health Choice, but were declined, whose parents can pay a reduced fee at time of service.  [facility]  [address], [place] [phone] Patients are seen by appointment only. Walk-ins are not accepted. [facility] will see patients 33 years of age and older. Monday - Tuesday (8am-5pm) Most Wednesdays (8:30-5pm) $30 per visit, cash only  [facility]  [address], [place] [phone] Patients are seen by appointment only. Walk-ins are not accepted. [facility] will see patients 33 years of age and older. One Wednesday Evening (Monthly: Volunteer Based).  $30 per visit, cash only  [facility]  [phone] for adults; Children under age 194, call Graduate Pediatric Dentistry at [phone]. Children aged 854-14, please call [phone] to request a pediatric application.  Dental services are provided in all areas of dental care including fillings, crowns and bridges, complete and  partial dentures, implants, gum treatment, root canals, and extractions. Preventive care is also provided. Treatment is provided to both adults and children. Patients are selected via a lottery and there is often a waiting list.   [facility] [address], [place]  [phone] [email or web address]   [facility] [address], [place], [place] [phone], Ext. [phone] Second and Fourth Thursday of each month, opens at 6:30 AM; Clinic ends at 9 AM.  Patients are seen on a first-come first-served basis, and a limited number are seen during each clinic.   [facility]  [address], [place], [place] [phone]   Eligibility Requirements You must have lived in [place], [place], or [place] counties for at least the last three months.   You cannot be eligible for state or federal sponsored [place]care insurance, including [employer], [place], or [employer]icare.   You generally cannot be eligible for healthcare insurance through your employer.    How to apply: Eligibility screenings are held every Tuesday and Wednesday afternoon from 1:00 pm until 4:00 pm. You do not need an appointment for the interview!  [facility] [address], [place], [place] [phone]   [facility]  [phone]   [facility]  [phone]   [facility]  [phone]

## 2014-04-07 NOTE — ED Provider Notes (Signed)
CSN: [id number]     Arrival date & time 04/07/14  2101 History  This chart was scribed for non-physician practitioner working with Raeford RazorStephen Kohut, MD by Elveria Risingimelie Horne, ED Scribe. This patient was seen in room WTR6/WTR6 and the patient's care was started at 9:55 PM.   Chief Complaint  Patient presents with  . Dental Pain   The history is provided by the patient. No language interpreter was used.   HPI Comments: Candace Cummings is a 33 y.o. female who presents to the Emergency Department complaining of intermittent left upper dental pain, ongoing for one month. Patient reports worsening constant pain for one week now. She locates site of pain at a broken tooth that is shooting into her head. Patient reports presence of a "bubble" in the roof of her mouth that resolved on it's own. Patient however reports continued pain.  Patient reports attempted treatment with ibuprofen, but denies relief. Patient reports exacerbated pain with eating. Patient shares that she is actively seeking Medicaid coverage so that she can have necessary dental work performed.   Past Medical History  Diagnosis Date  . Asthma   . Polycystic disease, ovaries    History reviewed. No pertinent past surgical history. History reviewed. No pertinent family history. History  Substance Use Topics  . Smoking status: Current Every Day Smoker  . Smokeless tobacco: Not on file  . Alcohol Use: No   OB History   Grav Para Term Preterm Abortions TAB SAB Ect Mult Living                 Review of Systems  Constitutional: Negative for fever and chills.  HENT: Positive for dental problem. Negative for ear pain, facial swelling, sore throat and trouble swallowing.   Respiratory: Negative for shortness of breath and stridor.   Musculoskeletal: Negative for neck pain.  Skin: Negative for color change.  Neurological: Positive for headaches.    Allergies  Amoxicillin; Penicillins cross reactors; and Septra  Home Medications    Prior to Admission medications   Medication Sig Start Date End Date Taking? Authorizing Provider  Biotin 2.5 MG CAPS Take 2.5 mg by mouth every evening.    Historical Provider, MD  loratadine (CLARITIN) 10 MG tablet Take 10 mg by mouth every evening.    Historical Provider, MD  metFORMIN (GLUCOPHAGE) 1000 MG tablet Take 1,000 mg by mouth every evening.     Historical Provider, MD  Norgestimate-Eth Estradiol (SPRINTEC 28 PO) Take 1 tablet by mouth daily.     Historical Provider, MD  spironolactone (ALDACTONE) 100 MG tablet Take 100 mg by mouth every evening.     Historical Provider, MD   Triage Vitals: BP 142/82  Pulse 77  Temp(Src) 98.2 F (36.8 C) (Oral)  SpO2 95%  LMP 03/13/2014  Physical Exam  Nursing note and vitals reviewed. Constitutional: She is oriented to person, place, and time. She appears well-developed and well-nourished. No distress.  HENT:  Head: Normocephalic and atraumatic.  Right Ear: Tympanic membrane, external ear and ear canal normal.  Left Ear: Tympanic membrane, external ear and ear canal normal.  Nose: Nose normal.  Mouth/Throat: Uvula is midline, oropharynx is clear and moist and mucous membranes are normal. No trismus in the jaw. Abnormal dentition. Dental caries present. No dental abscesses or uvula swelling. No tonsillar abscesses.  Patient with advanced periodontal disease. Multiple caries. Patient with L maxillary tooth pain and tenderness to palpation in area of first molar. All mandibular molars on left side are  broken the gumline. Minimal swelling or erythema noted on exam.  Eyes: Conjunctivae and EOM are normal.  Neck: Normal range of motion. Neck supple. No tracheal deviation present.  No neck swelling or Ludwig's angina  Cardiovascular: Normal rate.   Pulmonary/Chest: Effort normal. No respiratory distress.  Musculoskeletal: Normal range of motion.  Lymphadenopathy:    She has no cervical adenopathy.  Neurological: She is alert and oriented to  person, place, and time.  Skin: Skin is warm and dry.  Psychiatric: She has a normal mood and affect. Her behavior is normal.    ED Course  Procedures (including critical care time)  COORDINATION OF CARE: 9:58 PM- Will prescribe pain medication and antibiotic. Discussed treatment plan with patient at bedside and patient agreed to plan.    Labs Review Labs Reviewed - No data to display  Imaging Review No results found.   EKG Interpretation None      Patient seen and examined. Medications ordered.   Vital signs reviewed and are as follows: BP 142/82  Pulse 77  Temp(Src) 98.2 F (36.8 C) (Oral)  SpO2 95%  LMP 03/13/2014  Patient counseled on use of narcotic pain medications. Counseled not to combine these medications with others containing tylenol. Urged not to drink alcohol, drive, or perform any other activities that requires focus while taking these medications. The patient verbalizes understanding and agrees with the plan.  Patient counseled to take prescribed medications as directed, return with worsening facial or neck swelling, and to follow-up with their dentist as soon as possible.    MDM   Final diagnoses:  Pain, dental   Patient with toothache. No fever. Exam unconcerning for Ludwig's angina or other deep tissue infection in neck.   As there is gum swelling, erythema, will treat with antibiotic and pain medicine. Urged patient to follow-up with dentist.     I personally performed the services described in this documentation, which was scribed in my presence. The recorded information has been reviewed and is accurate.    Renne CriglerJoshua Esias Mory, PA-C 04/07/14 2342

## 2014-04-07 NOTE — ED Notes (Signed)
Pt states that she has had dental pain on the L upper side x 1 month on and off, worsening in the past week. Alert and oriented.

## 2014-04-09 NOTE — ED Provider Notes (Signed)
Medical screening examination/treatment/procedure(s) were performed by non-physician practitioner and as supervising physician I was immediately available for consultation/collaboration.   EKG Interpretation None       Raeford RazorStephen Alejandra Barna, MD 04/09/14 1120

## 2014-06-06 ENCOUNTER — Emergency Department (HOSPITAL_COMMUNITY)
Admission: EM | Admit: 2014-06-06 | Discharge: 2014-06-06 | Disposition: A | Discharge status: Home / Self Care | Payer: Self-pay | Attending: Emergency Medicine | Admitting: Emergency Medicine

## 2014-06-06 ENCOUNTER — Encounter (HOSPITAL_COMMUNITY): Payer: Self-pay

## 2014-06-06 DIAGNOSIS — Z79899 Other long term (current) drug therapy: Secondary | ICD-10-CM | POA: Insufficient documentation

## 2014-06-06 DIAGNOSIS — K088 Other specified disorders of teeth and supporting structures: Secondary | ICD-10-CM | POA: Insufficient documentation

## 2014-06-06 DIAGNOSIS — K0889 Other specified disorders of teeth and supporting structures: Secondary | ICD-10-CM

## 2014-06-06 DIAGNOSIS — Z8639 Personal history of other endocrine, nutritional and metabolic disease: Secondary | ICD-10-CM | POA: Insufficient documentation

## 2014-06-06 DIAGNOSIS — Z720 Tobacco use: Secondary | ICD-10-CM | POA: Insufficient documentation

## 2014-06-06 DIAGNOSIS — Z79818 Long term (current) use of other agents affecting estrogen receptors and estrogen levels: Secondary | ICD-10-CM | POA: Insufficient documentation

## 2014-06-06 DIAGNOSIS — J45909 Unspecified asthma, uncomplicated: Secondary | ICD-10-CM | POA: Insufficient documentation

## 2014-06-06 DIAGNOSIS — K029 Dental caries, unspecified: Secondary | ICD-10-CM | POA: Insufficient documentation

## 2014-06-06 DIAGNOSIS — Z880 Allergy status to penicillin: Secondary | ICD-10-CM | POA: Insufficient documentation

## 2014-06-06 MED ORDER — OXYCODONE-ACETAMINOPHEN 5-325 MG PO TABS
1.0000 | ORAL_TABLET | Freq: Once | ORAL | Status: AC
  Administered 2014-06-06: 1 via ORAL
  Filled 2014-06-06: qty 1

## 2014-06-06 MED ORDER — NAPROXEN 500 MG PO TABS: 500.0000 mg | ORAL_TABLET | Freq: Two times a day (BID) | ORAL | Status: DC

## 2014-06-06 MED ORDER — CLINDAMYCIN HCL 150 MG PO CAPS: 300.0000 mg | ORAL_CAPSULE | Freq: Three times a day (TID) | ORAL | Status: DC

## 2014-06-06 MED ORDER — HYDROCODONE-ACETAMINOPHEN 5-325 MG PO TABS: 1.0000 | ORAL_TABLET | Freq: Four times a day (QID) | ORAL | Status: DC | PRN

## 2014-06-06 NOTE — ED Provider Notes (Signed)
CSN: [id number]     Arrival date & time 06/06/14  1856 History   First MD Initiated Contact with Patient 06/06/14 1906     Chief Complaint  Patient presents with  . Dental Pain     (Consider location/radiation/quality/duration/timing/severity/associated sxs/prior Treatment) HPI Candace Cummings is a 33 y.o. female with no medical problems presents emergency department complaining of dental pain. Patient states she has had left upper first premolar pain for several weeks. Patient states to his decayed and she has had prior problems with it, but was unable to sit dentist because she has no insurance. She denies any fever or chills. She denies any facial swelling. She has tried over-the-counter medications with no relief of her pain. She states "tooth is throbbing, feel my heart beat in it." Patient states she is working on her medication, but is unsure when she is going get it.  Past Medical History  Diagnosis Date  . Asthma   . Polycystic disease, ovaries    History reviewed. No pertinent past surgical history. No family history on file. History  Substance Use Topics  . Smoking status: Current Every Day Smoker  . Smokeless tobacco: Not on file  . Alcohol Use: No   OB History    No data available     Review of Systems  Constitutional: Negative for fever and chills.  HENT: Positive for dental problem. Negative for facial swelling.   Respiratory: Negative for cough, chest tightness and shortness of breath.   Cardiovascular: Negative for chest pain, palpitations and leg swelling.  Musculoskeletal: Negative for myalgias, arthralgias, neck pain and neck stiffness.  Skin: Negative for rash.  Neurological: Negative for dizziness, weakness and headaches.  All other systems reviewed and are negative.     Allergies  Amoxicillin; Penicillins cross reactors; and Septra  Home Medications   Prior to Admission medications   Medication Sig Start Date End Date Taking? Authorizing  Provider  Biotin 2.5 MG CAPS Take 2.5 mg by mouth every evening.    Historical Provider, MD  clindamycin (CLEOCIN) 150 MG capsule Take 2 capsules (300 mg total) by mouth every 6 (six) hours. 04/07/14   Renne CriglerJoshua Geiple, PA-C  HYDROcodone-acetaminophen (NORCO/VICODIN) 5-325 MG per tablet Take 1-2 tablets every 6 hours as needed for severe pain 04/07/14   Renne CriglerJoshua Geiple, PA-C  loratadine (CLARITIN) 10 MG tablet Take 10 mg by mouth every evening.    Historical Provider, MD  metFORMIN (GLUCOPHAGE) 1000 MG tablet Take 1,000 mg by mouth every evening.     Historical Provider, MD  naproxen (NAPROSYN) 500 MG tablet Take 1 tablet (500 mg total) by mouth 2 (two) times daily. 04/07/14   Renne CriglerJoshua Geiple, PA-C  Norgestimate-Eth Estradiol (SPRINTEC 28 PO) Take 1 tablet by mouth daily.     Historical Provider, MD  spironolactone (ALDACTONE) 100 MG tablet Take 100 mg by mouth every evening.     Historical Provider, MD   BP 137/86 mmHg  Pulse 84  Temp(Src) 97.8 F (36.6 C) (Oral)  Resp 20  Ht 5\' 8"  (1.727 m)  Wt 278 lb (126.1 kg)  BMI 42.28 kg/m2  SpO2 99%  LMP 05/30/2014 Physical Exam  Constitutional: She appears well-developed and well-nourished. No distress.  HENT:  Head: Normocephalic.  Decayed to the gumline left upper first premolar. No surrounding gum swelling, no evidence of an abscess. No trismus. No swelling under the tongue.  Eyes: Conjunctivae are normal.  Neck: Neck supple.  Cardiovascular: Normal rate, regular rhythm and normal heart sounds.  Pulmonary/Chest: Effort normal and breath sounds normal. No respiratory distress. She has no wheezes. She has no rales.  Musculoskeletal: She exhibits no edema.  Neurological: She is alert.  Skin: Skin is warm and dry.  Psychiatric: She has a normal mood and affect. Her behavior is normal.  Nursing note and vitals reviewed.   ED Course  Procedures (including critical care time) Labs Review Labs Reviewed - No data to display  Imaging Review No  results found.   EKG Interpretation None      MDM   Final diagnoses:  Pain, dental   patient with dental pain, tooth is decayed to the gumline. She will need a follow-up with a dentist. Will start on antibiotic for possible early infection, pain management. Will refer to a dentist. Instructed to follow-up. At this time no emergent process, no Ludwig's angina, patient is not septic. Vital signs normal.  Filed Vitals:   06/06/14 1901 06/06/14 1909  BP: 137/86 137/86  Pulse: 84 84  Temp: 97.8 F (36.6 C) 97.8 F (36.6 C)  TempSrc: Oral Oral  Resp: 20 20  Height: 5\' 8"  (1.727 m) 5\' 8"  (1.727 m)  Weight: 278 lb 1.6 oz (126.145 kg) 278 lb (126.1 kg)  SpO2: 99% 99%       Lottie Musselatyana A Eloy Fehl, PA-C 06/06/14 2042  Doug SouSam Jacubowitz, MD 06/09/14 2253

## 2014-06-06 NOTE — ED Notes (Signed)
Pt c/o left upper left dental pain x 3 weeks

## 2014-06-06 NOTE — Discharge Instructions (Signed)
Antibiotic until all gone. Pain medications. Follow up as referred.    Dental Pain A tooth ache may be caused by cavities (tooth decay). Cavities expose the nerve of the tooth to air and hot or cold temperatures. It may come from an infection or abscess (also called a boil or furuncle) around your tooth. It is also often caused by dental caries (tooth decay). This causes the pain you are having. DIAGNOSIS  Your caregiver can diagnose this problem by exam. TREATMENT   If caused by an infection, it may be treated with medications which kill germs (antibiotics) and pain medications as prescribed by your caregiver. Take medications as directed.  Only take over-the-counter or prescription medicines for pain, discomfort, or fever as directed by your caregiver.  Whether the tooth ache today is caused by infection or dental disease, you should see your dentist as soon as possible for further care. SEEK MEDICAL CARE IF: The exam and treatment you received today has been provided on an emergency basis only. This is not a substitute for complete medical or dental care. If your problem worsens or new problems (symptoms) appear, and you are unable to meet with your dentist, call or return to this location. SEEK IMMEDIATE MEDICAL CARE IF:   You have a fever.  You develop redness and swelling of your face, jaw, or neck.  You are unable to open your mouth.  You have severe pain uncontrolled by pain medicine. MAKE SURE YOU:   Understand these instructions.  Will watch your condition.  Will get help right away if you are not doing well or get worse. Document Released: 05/30/2005 Document Revised: 08/22/2011 Document Reviewed: 01/16/2008 [facility] Patient Information 2015 [employer] [place]. This information is not intended to replace advice given to you by your health care provider. Make sure you discuss any questions you have with your health care provider.

## 2014-07-15 ENCOUNTER — Ambulatory Visit: Payer: Self-pay

## 2014-08-08 ENCOUNTER — Ambulatory Visit: Payer: Self-pay

## 2014-10-07 ENCOUNTER — Emergency Department
Admit: 2014-10-07 | Disposition: A | Discharge status: Home / Self Care | Payer: Self-pay | Admitting: Emergency Medicine

## 2015-05-24 ENCOUNTER — Emergency Department
Admission: EM | Admit: 2015-05-24 | Discharge: 2015-05-24 | Disposition: A | Discharge status: Home / Self Care | Payer: Self-pay | Attending: Emergency Medicine | Admitting: Emergency Medicine

## 2015-05-24 ENCOUNTER — Encounter: Payer: Self-pay | Admitting: Emergency Medicine

## 2015-05-24 DIAGNOSIS — Z79899 Other long term (current) drug therapy: Secondary | ICD-10-CM | POA: Insufficient documentation

## 2015-05-24 DIAGNOSIS — Z791 Long term (current) use of non-steroidal anti-inflammatories (NSAID): Secondary | ICD-10-CM | POA: Insufficient documentation

## 2015-05-24 DIAGNOSIS — J069 Acute upper respiratory infection, unspecified: Secondary | ICD-10-CM | POA: Insufficient documentation

## 2015-05-24 DIAGNOSIS — Z793 Long term (current) use of hormonal contraceptives: Secondary | ICD-10-CM | POA: Insufficient documentation

## 2015-05-24 DIAGNOSIS — F17200 Nicotine dependence, unspecified, uncomplicated: Secondary | ICD-10-CM | POA: Insufficient documentation

## 2015-05-24 DIAGNOSIS — Z880 Allergy status to penicillin: Secondary | ICD-10-CM | POA: Insufficient documentation

## 2015-05-24 DIAGNOSIS — Z7984 Long term (current) use of oral hypoglycemic drugs: Secondary | ICD-10-CM | POA: Insufficient documentation

## 2015-05-24 MED ORDER — PHENYLEPHRINE-CHLORPHEN-DM 3.5-1-3 MG/ML PO LIQD: 5.0000 mL | Freq: Four times a day (QID) | ORAL | Status: DC | PRN

## 2015-05-24 NOTE — ED Notes (Signed)
Pt has c/o productive cough, nasal drainage, sinus pressure since Friday and getting progressively worse. States she had a fever yesterday and took and OTC cold & fever medicine.

## 2015-05-24 NOTE — ED Notes (Signed)
NAD noted at time of D/C. Pt denies questions or concerns. Pt ambulatory to the lobby at this time.  

## 2015-05-24 NOTE — ED Provider Notes (Signed)
[facility] Emergency Department Provider Note  ____________________________________________  Time seen: Approximately 12:18 PM  I have reviewed the triage vital signs and the nursing notes.   HISTORY  Chief Complaint URI   HPI Candace Cummings is a 34 y.o. female is here with complaint of productive cough, nasal congestion for 3 days. Patient states that she felt feverish yesterday. Patient is been taking Mucinex cold and fever medication over-the-counter without any relief. Patient continues to smoke cigarettes.   Past Medical History  Diagnosis Date  . Asthma   . Polycystic disease, ovaries     There are no active problems to display for this patient.   History reviewed. No pertinent past surgical history.  Current Outpatient Rx  Name  Route  Sig  Dispense  Refill  . Biotin 2.5 MG CAPS   Oral   Take 2.5 mg by mouth every evening.         . chlorpheniramine-phenylephrine-dextromethorphan (CARDEC DM) 3.5-1-3 MG/ML solution   Oral   Take 5 mLs by mouth every 6 (six) hours as needed for cough.   120 mL   0   . loratadine (CLARITIN) 10 MG tablet   Oral   Take 10 mg by mouth every evening.         . metFORMIN (GLUCOPHAGE) 1000 MG tablet   Oral   Take 1,000 mg by mouth every evening.          . naproxen (NAPROSYN) 500 MG tablet   Oral   Take 1 tablet (500 mg total) by mouth 2 (two) times daily.   30 tablet   0   . Norgestimate-Eth Estradiol (SPRINTEC 28 PO)   Oral   Take 1 tablet by mouth daily.          Marland Kitchen. spironolactone (ALDACTONE) 100 MG tablet   Oral   Take 100 mg by mouth every evening.            Allergies Amoxicillin; Penicillins cross reactors; and Septra  History reviewed. No pertinent family history.  Social History Social History  Substance Use Topics  . Smoking status: Current Every Day Smoker  . Smokeless tobacco: None  . Alcohol Use: No    Review of Systems Constitutional: No fever/chills ENT: No  sore throat. Cardiovascular: Denies chest pain. Respiratory: Denies shortness of breath. Gastrointestinal: No abdominal pain.  No nausea, no vomiting.  No diarrhea.   Musculoskeletal: Negative for back pain. Skin: Negative for rash. Neurological: Negative for headaches, focal weakness or numbness.  10-point ROS otherwise negative.  ____________________________________________   PHYSICAL EXAM:  VITAL SIGNS: ED Triage Vitals  Enc Vitals Group     BP 05/24/15 1151 124/73 mmHg     Pulse Rate 05/24/15 1151 81     Resp 05/24/15 1151 18     Temp 05/24/15 1151 97.7 F (36.5 C)     Temp Source 05/24/15 1151 Oral     SpO2 05/24/15 1151 96 %     Weight 05/24/15 1151 265 lb (120.203 kg)     Height 05/24/15 1151 5\' 8"  (1.727 m)     Head Cir --      Peak Flow --      Pain Score 05/24/15 1207 0     Pain Loc --      Pain Edu? --      Excl. in GC? --     Constitutional: Alert and oriented. Well appearing and in no acute distress. Eyes: Conjunctivae are normal. PERRL. EOMI.  Head: Atraumatic. Nose: Mild congestion/low rhinnorhea. EACs and TMs are clear bilaterally. Mouth/Throat: Mucous membranes are moist.  Oropharynx non-erythematous. Neck: No stridor.   Hematological/Lymphatic/Immunilogical: No cervical lymphadenopathy. Cardiovascular: Normal rate, regular rhythm. Grossly normal heart sounds.  Good peripheral circulation. Respiratory: Normal respiratory effort.  No retractions. Lungs CTAB. Gastrointestinal: Soft and nontender. No distention.  Musculoskeletal: Moves upper and lower extremities without any difficulty. Neurologic:  Normal speech and language. No gross focal neurologic deficits are appreciated. No gait instability. Skin:  Skin is warm, dry and intact. No rash noted. Psychiatric: Mood and affect are normal. Speech and behavior are normal.  ____________________________________________   LABS (all labs ordered are listed, but only abnormal results are displayed)  Labs  Reviewed - No data to display  PROCEDURES  Procedure(s) performed: None  Critical Care performed: No  ____________________________________________   INITIAL IMPRESSION / ASSESSMENT AND PLAN / ED COURSE  Pertinent labs & imaging results that were available during my care of the patient were reviewed by me and considered in my medical decision making (see chart for details).  Patient was given a prescription for Cardec DM for cough and congestion. Patient insisted on a prescription medication. Patient will discontinue taking Mucinex. ____________________________________________   FINAL CLINICAL IMPRESSION(S) / ED DIAGNOSES  Final diagnoses:  Acute upper respiratory infection      Tommi Rumps, PA-C 05/24/15 1607  Jene Every, MD 05/25/15 [phone]

## 2015-05-24 NOTE — Discharge Instructions (Signed)
Upper Respiratory Infection, Adult Most upper respiratory infections (URIs) are caused by a virus. A URI affects the nose, throat, and upper air passages. The most common type of URI is often called "the common cold." HOME CARE   Take medicines only as told by your doctor.  Gargle warm saltwater or take cough drops to comfort your throat as told by your doctor.  Use a warm mist humidifier or inhale steam from a shower to increase air moisture. This may make it easier to breathe.  Drink enough fluid to keep your pee (urine) clear or pale yellow.  Eat soups and other clear broths.  Have a healthy diet.  Rest as needed.  Go back to work when your fever is gone or your doctor says it is okay.  You may need to stay home longer to avoid giving your URI to others.  You can also wear a face mask and wash your hands often to prevent spread of the virus.  Use your inhaler more if you have asthma.  Do not use any tobacco products, including cigarettes, chewing tobacco, or electronic cigarettes. If you need help quitting, ask your doctor. GET HELP IF:  You are getting worse, not better.  Your symptoms are not helped by medicine.  You have chills.  You are getting more short of breath.  You have brown or red mucus.  You have yellow or brown discharge from your nose.  You have pain in your face, especially when you bend forward.  You have a fever.  You have puffy (swollen) neck glands.  You have pain while swallowing.  You have white areas in the back of your throat. GET HELP RIGHT AWAY IF:   You have very bad or constant:  Headache.  Ear pain.  Pain in your forehead, behind your eyes, and over your cheekbones (sinus pain).  Chest pain.  You have long-lasting (chronic) lung disease and any of the following:  Wheezing.  Long-lasting cough.  Coughing up blood.  A change in your usual mucus.  You have a stiff neck.  You have changes in  your:  Vision.  Hearing.  Thinking.  Mood. MAKE SURE YOU:   Understand these instructions.  Will watch your condition.  Will get help right away if you are not doing well or get worse.   This information is not intended to replace advice given to you by your health care provider. Make sure you discuss any questions you have with your health care provider.   Document Released: 11/16/2007 Document Revised: 10/14/2014 Document Reviewed: 09/04/2013 Elsevier Interactive Patient Education 2016 [employer]   [facility]. Increase fluids. Stop Smoking. Discontinue Mucinex as she felt like this is not working. Begin Cardec DM 1 teaspoon every 6 hours as needed for cough, congestion, and runny nose.

## 2015-08-14 ENCOUNTER — Emergency Department
Admission: EM | Admit: 2015-08-14 | Discharge: 2015-08-14 | Disposition: A | Discharge status: Home / Self Care | Payer: Self-pay | Attending: Emergency Medicine | Admitting: Emergency Medicine

## 2015-08-14 ENCOUNTER — Encounter: Payer: Self-pay | Admitting: Emergency Medicine

## 2015-08-14 DIAGNOSIS — K0889 Other specified disorders of teeth and supporting structures: Secondary | ICD-10-CM | POA: Insufficient documentation

## 2015-08-14 DIAGNOSIS — Z7984 Long term (current) use of oral hypoglycemic drugs: Secondary | ICD-10-CM | POA: Insufficient documentation

## 2015-08-14 DIAGNOSIS — F17200 Nicotine dependence, unspecified, uncomplicated: Secondary | ICD-10-CM | POA: Insufficient documentation

## 2015-08-14 DIAGNOSIS — K029 Dental caries, unspecified: Secondary | ICD-10-CM | POA: Insufficient documentation

## 2015-08-14 DIAGNOSIS — Z880 Allergy status to penicillin: Secondary | ICD-10-CM | POA: Insufficient documentation

## 2015-08-14 DIAGNOSIS — Z79899 Other long term (current) drug therapy: Secondary | ICD-10-CM | POA: Insufficient documentation

## 2015-08-14 MED ORDER — OXYCODONE-ACETAMINOPHEN 5-325 MG PO TABS: 1.0000 | ORAL_TABLET | ORAL | Status: DC | PRN

## 2015-08-14 MED ORDER — LIDOCAINE VISCOUS 2 % MT SOLN: 20.0000 mL | OROMUCOSAL | Status: DC | PRN

## 2015-08-14 MED ORDER — NAPROXEN 500 MG PO TABS: 500.0000 mg | ORAL_TABLET | Freq: Two times a day (BID) | ORAL | Status: DC

## 2015-08-14 MED ORDER — AZITHROMYCIN 250 MG PO TABS: ORAL_TABLET | ORAL | Status: DC

## 2015-08-14 NOTE — ED Provider Notes (Signed)
[facility] Emergency Department Provider Note  ____________________________________________  Time seen: Approximately 4:22 PM  I have reviewed the triage vital signs and the nursing notes.   HISTORY  Chief Complaint Dental Pain    HPI Candace Cummings is a 35 y.o. female who presents for evaluation of a right-sided facial pain and dental pain.. Patient denies any trauma has had teeth extracted from her right upper molars several months ago with no complications or problems since then. Patient does have multiple dental caries noted to right lower molars. Patient states that the pain has been gradual onset over the past week no relief with any over-the-counter medications.   Past Medical History  Diagnosis Date  . Asthma   . Polycystic disease, ovaries     There are no active problems to display for this patient.   No past surgical history on file.  Current Outpatient Rx  Name  Route  Sig  Dispense  Refill  . azithromycin (ZITHROMAX Z-PAK) 250 MG tablet      Take 2 tablets (500 mg) on  Day 1,  followed by 1 tablet (250 mg) once daily on Days 2 through 5.   6 each   0   . Biotin 2.5 MG CAPS   Oral   Take 2.5 mg by mouth every evening.         . lidocaine (XYLOCAINE) 2 % solution   Mouth/Throat   Use as directed 20 mLs in the mouth or throat as needed for mouth pain.   100 mL   0   . metFORMIN (GLUCOPHAGE) 1000 MG tablet   Oral   Take 1,000 mg by mouth every evening.          . naproxen (NAPROSYN) 500 MG tablet   Oral   Take 1 tablet (500 mg total) by mouth 2 (two) times daily with a meal.   60 tablet   0   . Norgestimate-Eth Estradiol (SPRINTEC 28 PO)   Oral   Take 1 tablet by mouth daily.          Marland Kitchen oxyCODONE-acetaminophen (ROXICET) 5-325 MG tablet   Oral   Take 1-2 tablets by mouth every 4 (four) hours as needed for severe pain.   15 tablet   0   . spironolactone (ALDACTONE) 100 MG tablet   Oral   Take 100 mg by mouth  every evening.            Allergies Amoxicillin; Penicillins cross reactors; and Septra  No family history on file.  Social History Social History  Substance Use Topics  . Smoking status: Current Every Day Smoker  . Smokeless tobacco: None  . Alcohol Use: No    Review of Systems Constitutional: No fever/chills Eyes: No visual changes. ENT: Positive for right-sided facial dental pain. Cardiovascular: Denies chest pain. Respiratory: Denies shortness of breath. Gastrointestinal: No abdominal pain.  No nausea, no vomiting.  No diarrhea.  No constipation. Genitourinary: Negative for dysuria. Musculoskeletal: Negative for back pain. Skin: Negative for rash. Neurological: Negative for headaches, focal weakness or numbness.  10-point ROS otherwise negative.  ____________________________________________   PHYSICAL EXAM:  VITAL SIGNS: ED Triage Vitals  Enc Vitals Group     BP 08/14/15 1605 135/77 mmHg     Pulse Rate 08/14/15 1605 77     Resp 08/14/15 1605 16     Temp 08/14/15 1605 98.3 F (36.8 C)     Temp Source 08/14/15 1605 Oral     SpO2  08/14/15 1605 100 %     Weight 08/14/15 1605 265 lb (120.203 kg)     Height 08/14/15 1605 5\' 8"  (1.727 m)     Head Cir --      Peak Flow --      Pain Score 08/14/15 1605 10     Pain Loc --      Pain Edu? --      Excl. in GC? --     Constitutional: Alert and oriented. Well appearing and in no acute distress. Eyes: Conjunctivae are normal. PERRL. EOMI. Head: Atraumatic. TMs unremarkable Nose: No congestion/rhinnorhea. Mouth/Throat: Mucous membranes are moist.  Oropharynx non-erythematous. Edentulous on the right upper side with dental caries noted to the right lower molars. Sinuses nontender Neck: No stridor.   Skin:  Skin is warm, dry and intact. No rash noted. Psychiatric: Mood and affect are normal. Speech and behavior are normal.  ____________________________________________   LABS (all labs ordered are listed, but  only abnormal results are displayed)  Labs Reviewed - No data to display ____________________________________________    PROCEDURES  Procedure(s) performed: None  Critical Care performed: No  ____________________________________________   INITIAL IMPRESSION / ASSESSMENT AND PLAN / ED COURSE  Pertinent labs & imaging results that were available during my care of the patient were reviewed by me and considered in my medical decision making (see chart for details).  Dental caries with dental pain. Rx given for Zithromax secondary to penicillin allergy, Naprosyn and viscous lidocaine. Patient follow-up PCP or return to the ER. Patient has a dentist so no list was provided at this visit. ____________________________________________   FINAL CLINICAL IMPRESSION(S) / ED DIAGNOSES  Final diagnoses:  Pain, dental     This chart was dictated using voice recognition software/Dragon. Despite best efforts to proofread, errors can occur which can change the meaning. Any change was purely unintentional.   Evangeline Dakinharles M Beers, PA-C 08/14/15 1701  Minna AntisKevin Paduchowski, MD 08/14/15 2100

## 2015-08-14 NOTE — ED Notes (Signed)
Pt reports pain to upper right side of mouth, states "but there's nothing there". Reports pain x1 week.

## 2015-09-21 ENCOUNTER — Encounter: Payer: Self-pay | Admitting: Emergency Medicine

## 2015-09-21 ENCOUNTER — Emergency Department
Admission: EM | Admit: 2015-09-21 | Discharge: 2015-09-21 | Disposition: A | Discharge status: Home / Self Care | Payer: Self-pay | Attending: Emergency Medicine | Admitting: Emergency Medicine

## 2015-09-21 DIAGNOSIS — J45909 Unspecified asthma, uncomplicated: Secondary | ICD-10-CM | POA: Insufficient documentation

## 2015-09-21 DIAGNOSIS — F17200 Nicotine dependence, unspecified, uncomplicated: Secondary | ICD-10-CM | POA: Insufficient documentation

## 2015-09-21 DIAGNOSIS — K122 Cellulitis and abscess of mouth: Secondary | ICD-10-CM | POA: Insufficient documentation

## 2015-09-21 MED ORDER — IBUPROFEN 800 MG PO TABS: 800.0000 mg | ORAL_TABLET | Freq: Three times a day (TID) | ORAL | Status: DC | PRN

## 2015-09-21 MED ORDER — MAGIC MOUTHWASH W/LIDOCAINE: 5.0000 mL | Freq: Four times a day (QID) | ORAL | Status: DC | PRN

## 2015-09-21 MED ORDER — CLINDAMYCIN HCL 300 MG PO CAPS: 300.0000 mg | ORAL_CAPSULE | Freq: Three times a day (TID) | ORAL | Status: DC

## 2015-09-21 NOTE — ED Notes (Signed)
PT c/o left upper dental pain x few months, was seen here xmonth ago and was given lidocaine rinse and z-pak with no relief. States she's had intermit fevers at home. Pt denies any drainage or pus coming from tooth.

## 2015-09-21 NOTE — Discharge Instructions (Signed)

## 2015-09-21 NOTE — ED Notes (Signed)
Pt c/o upper left jaw pain. Denies any pus or drainage. States intermit fevers at home. No swelling noted

## 2015-09-21 NOTE — ED Provider Notes (Signed)
[facility] Emergency Department Provider Note  ____________________________________________  Time seen: Approximately 1:39 PM  I have reviewed the triage vital signs and the nursing notes.   HISTORY  Chief Complaint Dental Pain    HPI Candace Cummings is a 35 y.o. female , NAD, presents to the emergency department with left upper dental and jaw pain for some months. States she was seen in this emergency department one month ago but doesn't believe the infection was resolved with the medications prescribed. States she has tried to get in with the [facility] dental clinic but their wait list is more than 2 months long. She has also tried other dental agencies in the more immediate region but states she doesn't have the money to pay up front to be seen. States she also has had difficulty getting off from work to be able to attend appointments.Has had pain, redness, swelling about the left upper gum line around a posterior tooth that has multiple cavities. Denies any abdominal pain, nausea, vomiting. Has not had any fevers, chills, body aches. States the area is tender to touch and it has been difficult to chew on that side.   Past Medical History  Diagnosis Date  . Asthma   . Polycystic disease, ovaries     There are no active problems to display for this patient.   History reviewed. No pertinent past surgical history.  Current Outpatient Rx  Name  Route  Sig  Dispense  Refill  . Biotin 2.5 MG CAPS   Oral   Take 2.5 mg by mouth every evening.         . clindamycin (CLEOCIN) 300 MG capsule   Oral   Take 1 capsule (300 mg total) by mouth 3 (three) times daily.   30 capsule   0   . ibuprofen (ADVIL,MOTRIN) 800 MG tablet   Oral   Take 1 tablet (800 mg total) by mouth every 8 (eight) hours as needed (pain).   60 tablet   0   . magic mouthwash w/lidocaine SOLN   Oral   Take 5 mLs by mouth 4 (four) times daily as needed for mouth pain.   240 mL   0    Please mix 80mL diphenhydramine, 80mL nystatin, 80 ...   . metFORMIN (GLUCOPHAGE) 1000 MG tablet   Oral   Take 1,000 mg by mouth every evening.          Suzzanne Cloud Estradiol (SPRINTEC 28 PO)   Oral   Take 1 tablet by mouth daily.          Marland Kitchen spironolactone (ALDACTONE) 100 MG tablet   Oral   Take 100 mg by mouth every evening.            Allergies Amoxicillin; Penicillins cross reactors; and Septra  No family history on file.  Social History Social History  Substance Use Topics  . Smoking status: Current Every Day Smoker  . Smokeless tobacco: None  . Alcohol Use: No     Review of Systems  Constitutional: No fever/chills, fatigue Eyes: No visual changes.  ENT: Positive left upper gum line pain. Positive tooth pain.  Cardiovascular: No chest pain. Respiratory: No shortness of breath.   Gastrointestinal: No abdominal pain.  No nausea, vomiting.   Musculoskeletal: Positive for left upper jawline pain. Negative for neck pain.  Skin: Positive erythema and swelling about left upper gum line. Negative for oozing, weeping. Neurological: Negative for headaches, focal weakness or numbness. 10-point ROS otherwise negative.  ____________________________________________   PHYSICAL EXAM:  VITAL SIGNS: ED Triage Vitals  Enc Vitals Group     BP 09/21/15 1336 127/77 mmHg     Pulse Rate 09/21/15 1336 86     Resp 09/21/15 1336 16     Temp 09/21/15 1336 98.4 F (36.9 C)     Temp Source 09/21/15 1336 Oral     SpO2 09/21/15 1336 100 %     Weight --      Height --      Head Cir --      Peak Flow --      Pain Score 09/21/15 1336 10     Pain Loc --      Pain Edu? --      Excl. in GC? --      Constitutional: Alert and oriented. Well appearing and in no acute distress. Eyes: Conjunctivae are normal.  Head: Atraumatic. ENT:      Nose: No congestion/rhinnorhea.      Mouth/Throat: Left upper gum line with moderate erythema and swelling posteriorly. Posterior left  upper molar with large cavitary lesions but no oozing or weeping from the site. Tenderness to palpation of the left upper gum line. Mucous membranes are moist.  Neck: Supple with full range of motion. Hematological/Lymphatic/Immunilogical: No cervical lymphadenopathy. Cardiovascular:   Good peripheral circulation. Respiratory: Normal respiratory effort without tachypnea or retractions.  Musculoskeletal: Tenderness to palpation about the exterior portion of the left upper jawline. No TMJ tenderness. Neurologic:  Normal speech and language. No gross focal neurologic deficits are appreciated.  Skin:  Skin is warm, dry and intact. No erythema, warmth, swelling noted about the face. Psychiatric: Mood and affect are normal. Speech and behavior are normal. Patient exhibits appropriate insight and judgement.   ____________________________________________   LABS  None ____________________________________________  EKG  None ____________________________________________  RADIOLOGY  None ____________________________________________    PROCEDURES  Procedure(s) performed: None      Medications - No data to display   ____________________________________________   INITIAL IMPRESSION / ASSESSMENT AND PLAN / ED COURSE  Patient's diagnosis is consistent with cellulitis and abscess oral soft tissues. Patient will be discharged home with prescriptions for clindamycin, ibuprofen, magic mouthwash with lidocaine to use as directed. Patient given information about [facility] dental clinic to contact for further dental care.  Patient is given ED precautions to return to the ED for any worsening or new symptoms.    ____________________________________________  FINAL CLINICAL IMPRESSION(S) / ED DIAGNOSES  Final diagnoses:  Cellulitis and abscess of oral soft tissues      NEW MEDICATIONS STARTED DURING THIS VISIT:  New Prescriptions   CLINDAMYCIN (CLEOCIN) 300 MG CAPSULE    Take 1  capsule (300 mg total) by mouth 3 (three) times daily.   IBUPROFEN (ADVIL,MOTRIN) 800 MG TABLET    Take 1 tablet (800 mg total) by mouth every 8 (eight) hours as needed (pain).   MAGIC MOUTHWASH W/LIDOCAINE SOLN    Take 5 mLs by mouth 4 (four) times daily as needed for mouth pain.         Hope PigeonJami L Hagler, PA-C 09/21/15 1357  Jene Everyobert Kinner, MD 09/21/15 [phone]

## 2017-10-02 ENCOUNTER — Other Ambulatory Visit: Payer: Self-pay

## 2017-10-02 ENCOUNTER — Emergency Department
Admission: EM | Admit: 2017-10-02 | Discharge: 2017-10-03 | Disposition: A | Discharge status: Home / Self Care | Payer: BLUE CROSS/BLUE SHIELD | Attending: Emergency Medicine | Admitting: Emergency Medicine

## 2017-10-02 ENCOUNTER — Encounter: Payer: Self-pay | Admitting: Emergency Medicine

## 2017-10-02 DIAGNOSIS — K029 Dental caries, unspecified: Secondary | ICD-10-CM | POA: Insufficient documentation

## 2017-10-02 DIAGNOSIS — K047 Periapical abscess without sinus: Secondary | ICD-10-CM | POA: Diagnosis not present

## 2017-10-02 DIAGNOSIS — Z7984 Long term (current) use of oral hypoglycemic drugs: Secondary | ICD-10-CM | POA: Insufficient documentation

## 2017-10-02 DIAGNOSIS — Z79899 Other long term (current) drug therapy: Secondary | ICD-10-CM | POA: Diagnosis not present

## 2017-10-02 DIAGNOSIS — F17200 Nicotine dependence, unspecified, uncomplicated: Secondary | ICD-10-CM | POA: Diagnosis not present

## 2017-10-02 DIAGNOSIS — J45909 Unspecified asthma, uncomplicated: Secondary | ICD-10-CM | POA: Insufficient documentation

## 2017-10-02 DIAGNOSIS — K0889 Other specified disorders of teeth and supporting structures: Secondary | ICD-10-CM | POA: Diagnosis present

## 2017-10-02 MED ORDER — MAGIC MOUTHWASH W/LIDOCAINE: 5.0000 mL | Freq: Four times a day (QID) | ORAL | 0 refills | Status: DC

## 2017-10-02 MED ORDER — CLINDAMYCIN HCL 300 MG PO CAPS: 300.0000 mg | ORAL_CAPSULE | Freq: Four times a day (QID) | ORAL | 0 refills | Status: DC

## 2017-10-02 MED ORDER — CLINDAMYCIN HCL 150 MG PO CAPS
300.0000 mg | ORAL_CAPSULE | Freq: Once | ORAL | Status: AC
  Administered 2017-10-02: 300 mg via ORAL
  Filled 2017-10-02: qty 2

## 2017-10-02 MED ORDER — OXYCODONE-ACETAMINOPHEN 5-325 MG PO TABS
1.0000 | ORAL_TABLET | Freq: Once | ORAL | Status: AC
  Administered 2017-10-02: 1 via ORAL
  Filled 2017-10-02: qty 1

## 2017-10-02 MED ORDER — HYDROCODONE-ACETAMINOPHEN 5-325 MG PO TABS: 1.0000 | ORAL_TABLET | ORAL | 0 refills | Status: DC | PRN

## 2017-10-02 NOTE — ED Triage Notes (Signed)
Pt arrived to the ED accompanied by her  Mother for complaints of left upper and lower dental pain. Pt reports that she has really bad teeth and she could not reach her dentist and she is in severer pain. Pt is AOx4 in moderate pain.

## 2017-10-02 NOTE — ED Provider Notes (Signed)
[facility] Emergency Department Provider Note  ____________________________________________  Time seen: Approximately 11:06 PM  I have reviewed the triage vital signs and the nursing notes.   HISTORY  Chief Complaint Dental Pain    HPI Candace Cummings is a 37 y.o. female who presents the emergency department complaining of left upper and left lower dental pain.  Patient reports that she has significant bad dentition but has 2 teeth that are "rotten."  Patient reports that she attempted to contact her dentist but was unable to reach same.  She reports that the office was closed today.  Patient reports that symptoms have been increasing over the past 3-5 days.  No direct trauma to the area.  She denies any fevers or chills, difficulty breathing or swallowing, URI symptoms, chest pain, shortness of breath, abdominal pain.  Patient reported that the pain caused her to have nausea and one episode of emesis today.  No other complaints.  Over-the-counter medications with no relief of pain.  Past Medical History:  Diagnosis Date  . Asthma   . Polycystic disease, ovaries     There are no active problems to display for this patient.   History reviewed. No pertinent surgical history.  Prior to Admission medications   Medication Sig Start Date End Date Taking? Authorizing Provider  Biotin 2.5 MG CAPS Take 2.5 mg by mouth every evening.    [provider]  clindamycin (CLEOCIN) 300 MG capsule Take 1 capsule (300 mg total) by mouth 4 (four) times daily. 10/02/17   Karynn Deblasi, Delorise Royals, PA-C  HYDROcodone-acetaminophen (NORCO/VICODIN) 5-325 MG tablet Take 1 tablet by mouth every 4 (four) hours as needed for moderate pain. 10/02/17   Hardin Hardenbrook, Delorise Royals, PA-C  ibuprofen (ADVIL,MOTRIN) 800 MG tablet Take 1 tablet (800 mg total) by mouth every 8 (eight) hours as needed (pain). 09/21/15   Hagler, Jami L, PA-C  magic mouthwash w/lidocaine SOLN Take 5 mLs by mouth 4  (four) times daily. 10/02/17   Jamil Armwood, Delorise Royals, PA-C  metFORMIN (GLUCOPHAGE) 1000 MG tablet Take 1,000 mg by mouth every evening.     [provider]  Norgestimate-Eth Estradiol (SPRINTEC 28 PO) Take 1 tablet by mouth daily.     [provider]  spironolactone (ALDACTONE) 100 MG tablet Take 100 mg by mouth every evening.     [provider]  loratadine (CLARITIN) 10 MG tablet Take 10 mg by mouth every evening.  08/14/15  [provider]    Allergies Amoxicillin; Penicillins cross reactors; and Septra [bactrim]  History reviewed. No pertinent family history.  Social History Social History   Tobacco Use  . Smoking status: Current Every Day Smoker  . Smokeless tobacco: Never Used  Substance Use Topics  . Alcohol use: No  . Drug use: No     Review of Systems  Constitutional: No fever/chills Eyes: No visual changes. No discharge ENT: Left upper and lower dental pain Cardiovascular: no chest pain. Respiratory: no cough. No SOB. Gastrointestinal: No abdominal pain.  Nausea from the pain, one episode of emesis..  No diarrhea.  No constipation. Musculoskeletal: Negative for musculoskeletal pain. Skin: Negative for rash, abrasions, lacerations, ecchymosis. Neurological: Negative for headaches, focal weakness or numbness. 10-point ROS otherwise negative.  ____________________________________________   PHYSICAL EXAM:  VITAL SIGNS: ED Triage Vitals  Enc Vitals Group     BP 10/02/17 2145 (!) 157/89     Pulse Rate 10/02/17 2145 91     Resp 10/02/17 2145 18  Temp 10/02/17 2145 98.9 F (37.2 C)     Temp Source 10/02/17 2145 Oral     SpO2 10/02/17 2145 96 %     Weight 10/02/17 2158 265 lb (120.2 kg)     Height 10/02/17 2158 5\' 8"  (1.727 m)     Head Circumference --      Peak Flow --      Pain Score 10/02/17 2157 10     Pain Loc --      Pain Edu? --      Excl. in GC? --      Constitutional: Alert and oriented. Well appearing and in  no acute distress. Eyes: Conjunctivae are normal. PERRL. EOMI. Head: Atraumatic. ENT:      Ears:       Nose: No congestion/rhinnorhea.      Mouth/Throat: Mucous membranes are moist.  Significant caries throughout dentition.  Multiple teeth missing.  Patient has significant erosion into the dentition to tooth #25.  Patient also has significant erosion to tooth #36 with surrounding erythema.  No indication of appreciable abscess.  Uvula is midline. Neck: No stridor.   Hematological/Lymphatic/Immunilogical: No cervical lymphadenopathy. Cardiovascular: Normal rate, regular rhythm. Normal S1 and S2.  Good peripheral circulation. Respiratory: Normal respiratory effort without tachypnea or retractions. Lungs CTAB. Good air entry to the bases with no decreased or absent breath sounds. Musculoskeletal: Full range of motion to all extremities. No gross deformities appreciated. Neurologic:  Normal speech and language. No gross focal neurologic deficits are appreciated.  Skin:  Skin is warm, dry and intact. No rash noted. Psychiatric: Mood and affect are normal. Speech and behavior are normal. Patient exhibits appropriate insight and judgement.   ____________________________________________   LABS (all labs ordered are listed, but only abnormal results are displayed)  Labs Reviewed - No data to display ____________________________________________  EKG   ____________________________________________  RADIOLOGY   No results found.  ____________________________________________    PROCEDURES  Procedure(s) performed:    Procedures    Medications  clindamycin (CLEOCIN) capsule 300 mg (has no administration in time range)  oxyCODONE-acetaminophen (PERCOCET/ROXICET) 5-325 MG per tablet 1 tablet (has no administration in time range)     ____________________________________________   INITIAL IMPRESSION / ASSESSMENT AND PLAN / ED COURSE  Pertinent labs & imaging results that were  available during my care of the patient were reviewed by me and considered in my medical decision making (see chart for details).  Review of the [place] CSRS was performed in accordance of the [facility] prior to dispensing any controlled drugs.     Patient's diagnosis is consistent with dental pain, dental caries, dental infection.  Patient presents with bad dentition.  Exam reveals area concerning for mild dental infection as well as significant dental erosion into the pulp.  Patient will be placed on antibiotics for infection.  She is allergic to amoxicillin and penicillin compatible medications.  As such, she will be placed on clindamycin.. Patient will be discharged home with prescriptions for limited prescription of #12 of Vicodin and Magic mouthwash for symptom control.. Patient is to follow up with dentist as needed or otherwise directed. Patient is given ED precautions to return to the ED for any worsening or new symptoms.     ____________________________________________  FINAL CLINICAL IMPRESSION(S) / ED DIAGNOSES  Final diagnoses:  Pain due to dental caries  Dental abscess      NEW MEDICATIONS STARTED DURING THIS VISIT:  ED Discharge Orders        Ordered  clindamycin (CLEOCIN) 300 MG capsule  4 times daily     10/02/17 2314    HYDROcodone-acetaminophen (NORCO/VICODIN) 5-325 MG tablet  Every 4 hours PRN     10/02/17 2314    magic mouthwash w/lidocaine SOLN  4 times daily    Note to Pharmacy:  Dispense in a 1/1/1 ratio. Use lidocaine, diphenhydramine, prednisolone   10/02/17 2314          This chart was dictated using voice recognition software/Dragon. Despite best efforts to proofread, errors can occur which can change the meaning. Any change was purely unintentional.    Racheal Patches, PA-C 10/02/17 2336    Jeanmarie Plant, MD 10/05/17 2209

## 2018-11-05 ENCOUNTER — Encounter (HOSPITAL_COMMUNITY): Payer: Self-pay

## 2018-11-05 ENCOUNTER — Emergency Department (HOSPITAL_COMMUNITY)
Admission: EM | Admit: 2018-11-05 | Discharge: 2018-11-06 | Disposition: A | Discharge status: Home / Self Care | Payer: BLUE CROSS/BLUE SHIELD | Attending: Emergency Medicine | Admitting: Emergency Medicine

## 2018-11-05 ENCOUNTER — Other Ambulatory Visit: Payer: Self-pay

## 2018-11-05 DIAGNOSIS — F17200 Nicotine dependence, unspecified, uncomplicated: Secondary | ICD-10-CM | POA: Insufficient documentation

## 2018-11-05 DIAGNOSIS — R110 Nausea: Secondary | ICD-10-CM | POA: Insufficient documentation

## 2018-11-05 DIAGNOSIS — R1013 Epigastric pain: Secondary | ICD-10-CM | POA: Diagnosis present

## 2018-11-05 DIAGNOSIS — J45909 Unspecified asthma, uncomplicated: Secondary | ICD-10-CM | POA: Insufficient documentation

## 2018-11-05 DIAGNOSIS — Z79899 Other long term (current) drug therapy: Secondary | ICD-10-CM | POA: Diagnosis not present

## 2018-11-05 LAB — COMPREHENSIVE METABOLIC PANEL
ALT: 45 U/L — ABNORMAL HIGH (ref 0–44)
AST: 36 U/L (ref 15–41)
Albumin: 4.3 g/dL (ref 3.5–5.0)
Alkaline Phosphatase: 46 U/L (ref 38–126)
Anion gap: 6 (ref 5–15)
BUN: 10 mg/dL (ref 6–20)
CO2: 26 mmol/L (ref 22–32)
Calcium: 9.3 mg/dL (ref 8.9–10.3)
Chloride: 107 mmol/L (ref 98–111)
Creatinine, Ser: 0.66 mg/dL (ref 0.44–1.00)
GFR calc Af Amer: >60 mL/min (ref >60)
GFR calc non Af Amer: >60 mL/min (ref >60)
Glucose, Bld: 122 mg/dL — ABNORMAL HIGH (ref 70–99)
Potassium: 3.9 mmol/L (ref 3.5–5.1)
Sodium: 139 mmol/L (ref 135–145)
Total Bilirubin: 0.3 mg/dL (ref 0.3–1.2)
Total Protein: 7.5 g/dL (ref 6.5–8.1)

## 2018-11-05 LAB — CBC
HCT: 43.7 % (ref 36.0–46.0)
Hemoglobin: 14.7 g/dL (ref 12.0–15.0)
MCH: 31.5 pg (ref 26.0–34.0)
MCHC: 33.6 g/dL (ref 30.0–36.0)
MCV: 93.6 fL (ref 80.0–100.0)
Platelets: 338 10*3/uL (ref 150–400)
RBC: 4.67 MIL/uL (ref 3.87–5.11)
RDW: 12.3 % (ref 11.5–15.5)
WBC: 14 10*3/uL — ABNORMAL HIGH (ref 4.0–10.5)
nRBC: 0 % (ref 0.0–0.2)

## 2018-11-05 LAB — URINALYSIS, ROUTINE W REFLEX MICROSCOPIC
Bilirubin Urine: NEGATIVE
Glucose, UA: NEGATIVE mg/dL
Hgb urine dipstick: NEGATIVE
Ketones, ur: NEGATIVE mg/dL
Leukocytes,Ua: NEGATIVE
Nitrite: NEGATIVE
Protein, ur: NEGATIVE mg/dL
Specific Gravity, Urine: 1.01 (ref 1.005–1.030)
pH: 6 (ref 5.0–8.0)

## 2018-11-05 LAB — I-STAT BETA HCG BLOOD, ED (MC, WL, AP ONLY): I-stat hCG, quantitative: <5 m[IU]/mL (ref <5)

## 2018-11-05 LAB — LIPASE, BLOOD: Lipase: 31 U/L (ref 11–51)

## 2018-11-05 MED ORDER — MORPHINE SULFATE (PF) 4 MG/ML IV SOLN
4.0000 mg | Freq: Once | INTRAVENOUS | Status: AC
  Administered 2018-11-05: 4 mg via INTRAVENOUS
  Filled 2018-11-05: qty 1

## 2018-11-05 MED ORDER — ONDANSETRON HCL 4 MG/2ML IJ SOLN
4.0000 mg | Freq: Once | INTRAMUSCULAR | Status: AC
  Administered 2018-11-05: 4 mg via INTRAVENOUS
  Filled 2018-11-05: qty 2

## 2018-11-05 NOTE — ED Provider Notes (Signed)
[facility]-EMERGENCY DEPT Provider Note   CSN: [id number] Arrival date & time: 11/05/18  2200    History   Chief Complaint No chief complaint on file.   HPI Candace Cummings is a 38 y.o. female.     HPI  This is a 38 year old female with a history of polycystic kidney disease who presents with abdominal pain.  Patient reports 2-week history of epigastric pain that is nonradiating.  She reports that it is sharp and crampy.  Currently pain is 10 out of 10.  She had some vomiting last week.  She did see her primary physician who told her that she had H. pylori.  She is currently on antibiotics and a PPI.  She states that her pain has just gotten worse.  She reports persistent nausea but no recent vomiting.  Normal bowel movements.  Denies fevers, cough, shortness of breath.  Past Medical History:  Diagnosis Date  . Asthma   . Polycystic disease, ovaries     There are no active problems to display for this patient.   History reviewed. No pertinent surgical history.   OB History   No obstetric history on file.      Home Medications    Prior to Admission medications   Medication Sig Start Date End Date Taking? Authorizing Provider  acetaminophen (TYLENOL) 325 MG tablet Take 650 mg by mouth every 6 (six) hours as needed for moderate pain.   Yes [provider]  cetirizine (ZYRTEC) 10 MG tablet Take 10 mg by mouth daily.   Yes [provider]  clarithromycin (BIAXIN) 500 MG tablet Take 500 mg by mouth 2 (two) times daily.  11/02/18  Yes [provider]  metroNIDAZOLE (FLAGYL) 500 MG tablet Take 500 mg by mouth 2 (two) times daily.  11/02/18  Yes [provider]  Multiple Vitamin (MULTIVITAMIN WITH MINERALS) TABS tablet Take 1 tablet by mouth daily.   Yes [provider]  naphazoline-pheniramine (ALLERGY EYE) 0.025-0.3 % ophthalmic solution Place 1 drop into both eyes 4 (four) times daily as needed for eye  irritation.   Yes [provider]  omeprazole (PRILOSEC) 20 MG capsule Take 20 mg by mouth 2 (two) times daily before a meal. 11/02/18  Yes [provider]  clindamycin (CLEOCIN) 300 MG capsule Take 1 capsule (300 mg total) by mouth 4 (four) times daily. Patient not taking: Reported on 11/06/2018 10/02/17   Cuthriell, Delorise RoyalsJonathan D, PA-C  HYDROcodone-acetaminophen (NORCO/VICODIN) 5-325 MG tablet Take 1 tablet by mouth every 4 (four) hours as needed for moderate pain. Patient not taking: Reported on 11/06/2018 10/02/17   Cuthriell, Delorise RoyalsJonathan D, PA-C  ibuprofen (ADVIL,MOTRIN) 800 MG tablet Take 1 tablet (800 mg total) by mouth every 8 (eight) hours as needed (pain). Patient not taking: Reported on 11/06/2018 09/21/15   Hagler, Jami L, PA-C  magic mouthwash w/lidocaine SOLN Take 5 mLs by mouth 4 (four) times daily. Patient not taking: Reported on 11/06/2018 10/02/17   Cuthriell, Delorise RoyalsJonathan D, PA-C    Family History History reviewed. No pertinent family history.  Social History Social History   Tobacco Use  . Smoking status: Current Every Day Smoker  . Smokeless tobacco: Never Used  Substance Use Topics  . Alcohol use: No  . Drug use: No     Allergies   Doxycycline; Amoxicillin; Penicillins cross reactors; and Septra [bactrim]   Review of Systems Review of Systems  Constitutional: Negative for fever.  Respiratory: Negative for shortness of breath.  Cardiovascular: Negative for chest pain.  Gastrointestinal: Positive for abdominal pain, nausea and vomiting. Negative for constipation and diarrhea.  Genitourinary: Negative for dysuria.  Neurological: Negative for weakness.  All other systems reviewed and are negative.    Physical Exam Updated Vital Signs BP 127/83 (BP Location: Right Arm)   Pulse 80   Temp 98.8 F (37.1 C) (Oral)   Resp 18   SpO2 98%   Physical Exam Vitals signs and nursing note reviewed.  Constitutional:      Appearance: She is well-developed.      Comments: Morbidly obese  HENT:     Head: Normocephalic and atraumatic.     Mouth/Throat:     Mouth: Mucous membranes are moist.  Eyes:     Pupils: Pupils are equal, round, and reactive to light.  Neck:     Musculoskeletal: Neck supple.  Cardiovascular:     Rate and Rhythm: Normal rate and regular rhythm.     Heart sounds: Normal heart sounds.  Pulmonary:     Effort: Pulmonary effort is normal. No respiratory distress.     Breath sounds: No wheezing.  Abdominal:     General: Bowel sounds are normal.     Palpations: Abdomen is soft.     Tenderness: There is abdominal tenderness. There is no guarding or rebound.     Comments: Epigastric tenderness to palpation, no rebound or guarding, exam limited secondary to body habitus  Musculoskeletal:     Right lower leg: No edema.     Left lower leg: No edema.  Skin:    General: Skin is warm and dry.  Neurological:     Mental Status: She is alert and oriented to person, place, and time.  Psychiatric:        Mood and Affect: Mood normal.      ED Treatments / Results  Labs (all labs ordered are listed, but only abnormal results are displayed) Labs Reviewed  COMPREHENSIVE METABOLIC PANEL - Abnormal; Notable for the following components:      Result Value   Glucose, Bld 122 (*)    ALT 45 (*)    All other components within normal limits  CBC - Abnormal; Notable for the following components:   WBC 14.0 (*)    All other components within normal limits  LIPASE, BLOOD  URINALYSIS, ROUTINE W REFLEX MICROSCOPIC  I-STAT BETA HCG BLOOD, ED ([facility], [facility], [facility] ONLY)    EKG None  Radiology Ct Abdomen Pelvis W Contrast  Result Date: 11/06/2018 CLINICAL DATA:  Right upper quadrant pain. EXAM: CT ABDOMEN AND PELVIS WITH CONTRAST TECHNIQUE: Multidetector CT imaging of the abdomen and pelvis was performed using the standard protocol following bolus administration of intravenous contrast. CONTRAST:  OMNIPAQUE IOHEXOL 300 MG/ML  SOLN COMPARISON:   None. FINDINGS: Lower chest: No acute abnormality. Hepatobiliary: There is a paddle megaly with diffuse hepatic steatosis. The gallbladder is unremarkable. There is no biliary ductal dilatation. Pancreas: Unremarkable. No pancreatic ductal dilatation or surrounding inflammatory changes. Spleen: Normal in size without focal abnormality. Adrenals/Urinary Tract: Adrenal glands are unremarkable. Kidneys are normal, without renal calculi, focal lesion, or hydronephrosis. Bladder is unremarkable. Stomach/Bowel: Stomach is within normal limits. Appendix appears normal. No evidence of bowel wall thickening, distention, or inflammatory changes. Vascular/Lymphatic: No significant vascular findings are present. No enlarged abdominal or pelvic lymph nodes. Reproductive: Uterus and bilateral adnexa are unremarkable. Other: No abdominal wall hernia or abnormality. No abdominopelvic ascites. Musculoskeletal: No acute or significant osseous findings. IMPRESSION: 1. No  acute intra-abdominal abnormality detected. 2. Hepatomegaly with hepatic steatosis. Electronically Signed   By: Katherine Mantle M.D.   On: 11/06/2018 00:39    Procedures Procedures (including critical care time)  Medications Ordered in ED Medications  sodium chloride (PF) 0.9 % injection (has no administration in time range)  morphine 4 MG/ML injection 4 mg (4 mg Intravenous Given 11/05/18 2336)  ondansetron (ZOFRAN) injection 4 mg (4 mg Intravenous Given 11/05/18 2336)  iohexol (OMNIPAQUE) 300 MG/ML solution 100 mL (100 mLs Intravenous Contrast Given 11/06/18 0021)  alum & mag hydroxide-simeth (MAALOX/MYLANTA) 200-200-20 MG/5ML suspension 30 mL (30 mLs Oral Given 11/06/18 0124)  dicyclomine (BENTYL) 10 MG/5ML syrup 10 mg (10 mg Oral Given 11/06/18 0125)     Initial Impression / Assessment and Plan / ED Course  I have reviewed the triage vital signs and the nursing notes.  Pertinent labs & imaging results that were available during my care of the  patient were reviewed by me and considered in my medical decision making (see chart for details).        Patient presents with persistent and ongoing upper abdominal pain.  Recent treatment for H. pylori.  She is overall nontoxic-appearing and vital signs are reassuring.  She does have tenderness of the epigastrium.  Her exam is severely limited secondary to body habitus.  Lab work obtained and notable for leukocytosis.  Otherwise LFTs and lipase are reassuring.  Given location of pain, would like to evaluate her gallbladder; however, given body habitus, feel an ultrasound would be low yield.  Will obtain a CT scan.  CT scan is reassuring.  No pancreatic inflammation or gallbladder inflammation noted on scan.  Patient improved with pain medication and a GI cocktail.  Suspect she may have ongoing issues with H. pylori and will need to continue treatment as she is only been on treatment for 3 days.  I discussed this with the patient.  She will be given GI referral.  After history, exam, and medical workup I feel the patient has been appropriately medically screened and is safe for discharge home. Pertinent diagnoses were discussed with the patient. Patient was given return precautions.   Final Clinical Impressions(s) / ED Diagnoses   Final diagnoses:  Epigastric abdominal pain    ED Discharge Orders    None       , Mayer Masker, MD 11/06/18 [phone]

## 2018-11-05 NOTE — ED Triage Notes (Signed)
Pt reports that she ate a raw rotisserie chicken about a month ago and has been having abdominal pain since. The current episode started about an hour ago. She reports pain in the RUQ and area is tender. Denies vomiting, but has been experiencing diarrhea intermittently.

## 2018-11-06 ENCOUNTER — Encounter (HOSPITAL_COMMUNITY): Payer: Self-pay

## 2018-11-06 ENCOUNTER — Emergency Department (HOSPITAL_COMMUNITY): Payer: BLUE CROSS/BLUE SHIELD

## 2018-11-06 MED ORDER — ALUM & MAG HYDROXIDE-SIMETH 200-200-20 MG/5ML PO SUSP
30.0000 mL | Freq: Once | ORAL | Status: AC
  Administered 2018-11-06: 01:00:00 30 mL via ORAL
  Filled 2018-11-06: qty 30

## 2018-11-06 MED ORDER — SODIUM CHLORIDE (PF) 0.9 % IJ SOLN
INTRAMUSCULAR | Status: AC
  Filled 2018-11-06: qty 50

## 2018-11-06 MED ORDER — IOHEXOL 300 MG/ML  SOLN
100.0000 mL | Freq: Once | INTRAMUSCULAR | Status: AC | PRN
  Administered 2018-11-06: 100 mL via INTRAVENOUS

## 2018-11-06 MED ORDER — DICYCLOMINE HCL 10 MG/5ML PO SOLN
10.0000 mg | Freq: Once | ORAL | Status: AC
  Administered 2018-11-06: 10 mg via ORAL
  Filled 2018-11-06: qty 5

## 2018-11-06 NOTE — ED Notes (Signed)
Provided patient Sprite with her meds and to sip on.

## 2018-11-06 NOTE — Discharge Instructions (Addendum)
You were seen today for persistent abdominal pain.  Your work-up is largely reassuring including CT scan.  Pain may be related to reflux or H. pylori as previously diagnosed by your primary physician.  Continue antacids and antibiotics as instructed.  Bland diet recommended.  Follow-up closely with your primary physician.  If pain persists, you may need to see a GI doctor.

## 2018-11-06 NOTE — ED Notes (Signed)
Patient transported to CT 

## 2018-12-05 ENCOUNTER — Ambulatory Visit
Admission: EM | Admit: 2018-12-05 | Discharge: 2018-12-05 | Disposition: A | Discharge status: Home / Self Care | Payer: BC Managed Care – PPO | Attending: Urgent Care | Admitting: Urgent Care

## 2018-12-05 ENCOUNTER — Other Ambulatory Visit: Payer: Self-pay

## 2018-12-05 ENCOUNTER — Encounter: Payer: Self-pay | Admitting: Emergency Medicine

## 2018-12-05 DIAGNOSIS — R1011 Right upper quadrant pain: Secondary | ICD-10-CM | POA: Diagnosis not present

## 2018-12-05 DIAGNOSIS — Z8619 Personal history of other infectious and parasitic diseases: Secondary | ICD-10-CM | POA: Diagnosis not present

## 2018-12-05 NOTE — ED Triage Notes (Signed)
Pt states that she was treated about a month ago for H pylori. She was treated and finished the treatment. Her symptoms came back today. She is having RUQ swelling, pain and vomiting. She is also having back pain from falling down stairs.

## 2018-12-05 NOTE — ED Provider Notes (Signed)
Name: Candace Cummings DOB: 03/19/1981 MRN: [id number] CSN: [id number] PCP: Patient, No Pcp Per  Arrival date and time:  12/05/18 1510  Chief Complaint:  Abdominal Pain and Back Pain  NOTE: Prior to seeing the patient today, I have reviewed the triage nursing documentation and vital signs. Clinical staff has updated patient's PMH/PSHx, current medication list, and drug allergies/intolerances to ensure comprehensive history available to assist in medical decision making.   History:   HPI: Candace Killabitha M Solazzo is a 38 y.o. female who presents today with complaints of pain in the RIGHT upper quadrant of her abdomen that began earlier today. She describes "swelling" in her abdomen, nausea, and vomiting. She denies associated diarrhea. Patient notes that the pain is like a "deep ache" and that does not radiate into her back or shoulder. Pain is not affected by dietary intake. Patient has not experienced any fevers or chills.   Of note, patient treated x 1 month ago with antibiotics and PPI for H. pylori infection. Patient was treated by "some wellness place in [place]".  No testing for resolution or referral to gastroenterology per patient report. She had negative CT imaging of her abdomen and pelvis on 11/05/2018 that revealed hepatomegaly with steatosis. At that time, her gallbladder was unremarkable and there was no biliary ductal dilatation. Patient states, "I wanted to catch it early this time. It feels exactly like it did before when I had the infection".   Additionally, patient complains of pain in her coccyx area following a fall 1-2 months ago. Patient describes the pain as "sore". She denies pain exacerbation caused by sitting. She did not seek care at the time of the fall.   Past Medical History:  Diagnosis Date  . Asthma   . Polycystic disease, ovaries     History reviewed. No pertinent surgical history.  Family History  Problem Relation Age of Onset  . Healthy Mother   . Healthy  Father     Social History   Socioeconomic History  . Marital status: Single    Spouse name: Not on file  . Number of children: Not on file  . Years of education: Not on file  . Highest education level: Not on file  Occupational History  . Not on file  Social Needs  . Financial resource strain: Not on file  . Food insecurity    Worry: Not on file    Inability: Not on file  . Transportation needs    Medical: Not on file    Non-medical: Not on file  Tobacco Use  . Smoking status: Current Every Day Smoker  . Smokeless tobacco: Never Used  Substance and Sexual Activity  . Alcohol use: No  . Drug use: No  . Sexual activity: Not on file  Lifestyle  . Physical activity    Days per week: Not on file    Minutes per session: Not on file  . Stress: Not on file  Relationships  . Social Musicianconnections    Talks on phone: Not on file    Gets together: Not on file    Attends religious service: Not on file    Active member of club or organization: Not on file    Attends meetings of clubs or organizations: Not on file    Relationship status: Not on file  . Intimate partner violence    Fear of current or ex partner: Not on file    Emotionally abused: Not on file    Physically abused:  Not on file    Forced sexual activity: Not on file  Other Topics Concern  . Not on file  Social History Narrative  . Not on file    There are no active problems to display for this patient.   Home Medications:    Current Meds  Medication Sig  . acetaminophen (TYLENOL) 325 MG tablet Take 650 mg by mouth every 6 (six) hours as needed for moderate pain.  . cetirizine (ZYRTEC) 10 MG tablet Take 10 mg by mouth daily.  . Multiple Vitamin (MULTIVITAMIN WITH MINERALS) TABS tablet Take 1 tablet by mouth daily.  Marland Kitchen. omeprazole (PRILOSEC) 20 MG capsule Take 20 mg by mouth 2 (two) times daily before a meal.    Allergies:   Doxycycline, Amoxicillin, Penicillins cross reactors, and Septra [bactrim]  Review  of Systems (ROS): Review of Systems  Constitutional: Negative for chills and fever.  Respiratory: Negative for cough and shortness of breath.   Cardiovascular: Negative for chest pain and palpitations.  Gastrointestinal: Positive for abdominal distention, abdominal pain (RUQ), nausea and vomiting. Negative for blood in stool, constipation and diarrhea.       PMH (+) for recent H.pylori infection  Genitourinary: Negative for dysuria, hematuria and urgency.  Musculoskeletal: Positive for back pain (coccyx x 1-2 months s/p fall).  Skin: Negative for pallor.  Neurological: Negative for dizziness, weakness and headaches.  Hematological: Negative for adenopathy.     Physical Exam:  Triage Vital Signs ED Triage Vitals  Enc Vitals Group     BP 12/05/18 1604 125/77     Pulse Rate 12/05/18 1604 76     Resp 12/05/18 1604 18     Temp 12/05/18 1604 98.4 F (36.9 C)     Temp Source 12/05/18 1604 Oral     SpO2 12/05/18 1604 99 %     Weight 12/05/18 1600 260 lb (117.9 kg)     Height 12/05/18 1600 5\' 8"  (1.727 m)     Head Circumference --      Peak Flow --      Pain Score 12/05/18 1600 8     Pain Loc --      Pain Edu? --      Excl. in GC? --     Physical Exam  Constitutional: She is oriented to person, place, and time and well-developed, well-nourished, and in no distress.  HENT:  Head: Normocephalic and atraumatic.  Mouth/Throat: Oropharynx is clear and moist and mucous membranes are normal.  Eyes: Pupils are equal, round, and reactive to light. EOM are normal.  Neck: Normal range of motion. Neck supple. No tracheal deviation present.  Cardiovascular: Normal rate, regular rhythm, normal heart sounds and intact distal pulses. Exam reveals no gallop and no friction rub.  No murmur heard. Pulmonary/Chest: Effort normal and breath sounds normal. No respiratory distress. She has no wheezes. She has no rales.  Abdominal: Soft. Normal appearance and bowel sounds are normal. There is  hepatomegaly. There is abdominal tenderness in the right upper quadrant and epigastric area. There is no rebound, no guarding, no CVA tenderness and negative Murphy's sign.  Musculoskeletal:     Lumbar back: She exhibits normal range of motion, no tenderness, no bony tenderness, no swelling, no deformity, no pain and no spasm.  Lymphadenopathy:    She has no cervical adenopathy.  Neurological: She is alert and oriented to person, place, and time.  Skin: Skin is warm and dry. No rash noted. No erythema.  Psychiatric: Mood, affect and judgment  normal.  Nursing note and vitals reviewed.    Urgent Care Treatments / Results:   LABS: PLEASE NOTE: all labs that were ordered this encounter are listed, however only abnormal results are displayed. Labs Reviewed  H PYLORI, IGM, IGG, IGA AB    EKG: -None  RADIOLOGY: -None  PROCEDURES: Procedures  MEDICATIONS RECEIVED THIS VISIT: Medications - No data to display  PERTINENT CLINICAL COURSE NOTES/UPDATES:   Initial Impression / Assessment and Plan / Urgent Care Course:    Candace Killabitha M Rabine is a 38 y.o. female who [facility] today with complaints of Abdominal Pain and Back Pain   Pertinent labs & imaging results that were available during my care of the patient were personally reviewed by me and considered in my medical decision making (see lab/imaging section of note for values and interpretations).  Patient overall well appearing and in no acute distress today in clinic. Exam reveals tenderness to RIGHT upper quadrant and epigastric area of the abdomen. Patient has had some nausea and a single episode of vomiting today. No fevers or diarrhea. PMH (+) for recent H.pylori that was treated by "a wellness place in [place] She did not have follow up testing for resolution. She was not referred to gastroenterology. Discussed retesting for H.pylori stool Ag, however patient refused citing that her testing was performed via blood  last time and this is what she wants do today. Discussed need to test for eradication within the gut, however patient continues to refuse citing that her infection was diagnosed via blood the first time. Will proceed with serological testing today per patient's request; sample sent to [facility] for H.pylori IgG, IgM, and IgA Ab. Given patient's recurrent symptoms in the setting of recently negative imaging, she would benefit from being seen by gastroenterology. Patient advised that they would likely require stool testing for the H.pylori Ag to determine eradication. Ultimately, the patient is likely going to need an EGD, however the decision to proceed with invasive evaluation will need to be discussed between her and gastroenterology. [facility] to [facility] today to secure patient appointment. GI practice graciously offered a new patient appointment tomorrow (12/06/2018) at 11:00 am. Patient made aware and has plans to attend the scheduled appointment that I have arranged for her.   Regarding her back pain. There is low suspicious for coccygeal injury. Given the chronicity of her injury, coupled with the fact that she has no external tenderness, it is felt that diagnostic imaging will be of little benefit. Discuss likely contusion. Recommended to continue conservative treatment at home with moist heat and IBU on a PRN basis. If pain not improving, she will need to follow up with her PCP to discuss further.   Discussed follow up with primary care physician in 1 week for re-evaluation. I have reviewed the follow up and strict return precautions for any new or worsening symptoms. Patient is aware of symptoms that would be deemed urgent/emergent, and would thus require further evaluation either here or in the emergency department. At the time of discharge, she verbalized understanding and consent with the discharge plan as it was reviewed with her. All questions were fielded by provider and/or clinic staff  prior to patient discharge.    Final Clinical Impressions(s) / Urgent Care Diagnoses:   Final diagnoses:  Right upper quadrant abdominal pain  History of Helicobacter pylori infection    New Prescriptions:  No orders of the defined types were placed in this encounter.   Controlled  Substance Prescriptions:  [place] Controlled Substance Registry consulted? Not Applicable  NOTE: This note was prepared using Dragon dictation software along with smaller phrase technology. Despite my best ability to proofread, there is the potential that transcriptional errors may still occur from this process, and are completely unintentional.     Karen Kitchens, NP 12/05/18 2220

## 2018-12-05 NOTE — Discharge Instructions (Addendum)
It was very nice seeing you today in clinic. Thank you for entrusting me with your care.   As discussed, you need to see GI. They are working you in tomorrow at 11:00 am.   If your symptoms/condition worsens, please seek follow up care either here or in the ER. Please remember, our [place] providers are "right here with you" when you need [place].   Again, it was my pleasure to take care of you today. Thank you for choosing our clinic. I hope that you start to feel better quickly.   Honor Loh, MSN, APRN, FNP-C, CEN Advanced Practice Provider [facility]

## 2018-12-06 LAB — H PYLORI, IGM, IGG, IGA AB
H Pylori IgG: 1.2 Index Value — ABNORMAL HIGH (ref 0.00–0.79)
H. Pylogi, Iga Abs: <9 units (ref 0.0–8.9)
H. Pylogi, Igm Abs: 12.3 units — ABNORMAL HIGH (ref 0.0–8.9)

## 2018-12-14 ENCOUNTER — Other Ambulatory Visit: Payer: Self-pay

## 2018-12-14 ENCOUNTER — Encounter
Admission: RE | Admit: 2018-12-14 | Discharge: 2018-12-14 | Disposition: A | Discharge status: Home / Self Care | Payer: BC Managed Care – PPO | Source: Ambulatory Visit | Attending: Gastroenterology | Admitting: Gastroenterology

## 2018-12-14 DIAGNOSIS — Z01812 Encounter for preprocedural laboratory examination: Secondary | ICD-10-CM | POA: Insufficient documentation

## 2018-12-14 DIAGNOSIS — Z1159 Encounter for screening for other viral diseases: Secondary | ICD-10-CM | POA: Diagnosis not present

## 2018-12-15 LAB — SARS CORONAVIRUS 2 (TAT 6-24 HRS): SARS Coronavirus 2: NEGATIVE

## 2018-12-17 MED ORDER — SODIUM CHLORIDE 0.9 % IV SOLN
INTRAVENOUS | Status: DC
  Administered 2018-12-18: 13:00:00 via INTRAVENOUS

## 2018-12-18 ENCOUNTER — Encounter
Admission: RE | Disposition: A | Discharge status: Home / Self Care | Payer: Self-pay | Source: Home / Self Care | Attending: Gastroenterology

## 2018-12-18 ENCOUNTER — Ambulatory Visit: Payer: BC Managed Care – PPO | Admitting: Certified Registered Nurse Anesthetist

## 2018-12-18 ENCOUNTER — Encounter: Payer: Self-pay | Admitting: Anesthesiology

## 2018-12-18 ENCOUNTER — Other Ambulatory Visit: Payer: Self-pay

## 2018-12-18 ENCOUNTER — Ambulatory Visit
Admission: RE | Admit: 2018-12-18 | Discharge: 2018-12-18 | Disposition: A | Discharge status: Home / Self Care | Payer: BC Managed Care – PPO | Attending: Gastroenterology | Admitting: Gastroenterology

## 2018-12-18 DIAGNOSIS — J45909 Unspecified asthma, uncomplicated: Secondary | ICD-10-CM | POA: Diagnosis not present

## 2018-12-18 DIAGNOSIS — K2960 Other gastritis without bleeding: Secondary | ICD-10-CM | POA: Diagnosis not present

## 2018-12-18 DIAGNOSIS — Z8619 Personal history of other infectious and parasitic diseases: Secondary | ICD-10-CM | POA: Diagnosis not present

## 2018-12-18 DIAGNOSIS — E282 Polycystic ovarian syndrome: Secondary | ICD-10-CM | POA: Diagnosis not present

## 2018-12-18 DIAGNOSIS — Z09 Encounter for follow-up examination after completed treatment for conditions other than malignant neoplasm: Secondary | ICD-10-CM | POA: Diagnosis present

## 2018-12-18 DIAGNOSIS — Z79899 Other long term (current) drug therapy: Secondary | ICD-10-CM | POA: Diagnosis not present

## 2018-12-18 DIAGNOSIS — R1013 Epigastric pain: Secondary | ICD-10-CM | POA: Insufficient documentation

## 2018-12-18 DIAGNOSIS — K228 Other specified diseases of esophagus: Secondary | ICD-10-CM | POA: Diagnosis not present

## 2018-12-18 DIAGNOSIS — R1011 Right upper quadrant pain: Secondary | ICD-10-CM | POA: Insufficient documentation

## 2018-12-18 HISTORY — PX: ESOPHAGOGASTRODUODENOSCOPY (EGD) WITH PROPOFOL: SHX5813

## 2018-12-18 LAB — POCT PREGNANCY, URINE: Preg Test, Ur: NEGATIVE

## 2018-12-18 SURGERY — ESOPHAGOGASTRODUODENOSCOPY (EGD) WITH PROPOFOL
Anesthesia: General

## 2018-12-18 MED ORDER — LIDOCAINE HCL (CARDIAC) PF 100 MG/5ML IV SOSY
PREFILLED_SYRINGE | INTRAVENOUS | Status: DC | PRN
  Administered 2018-12-18: 100 mg via INTRAVENOUS

## 2018-12-18 MED ORDER — SODIUM CHLORIDE 0.9 % IV SOLN
INTRAVENOUS | Status: DC
  Administered 2018-12-18: 13:00:00 via INTRAVENOUS

## 2018-12-18 MED ORDER — PROPOFOL 10 MG/ML IV BOLUS
INTRAVENOUS | Status: DC | PRN
  Administered 2018-12-18: 100 mg via INTRAVENOUS
  Administered 2018-12-18: 20 mg via INTRAVENOUS
  Administered 2018-12-18 (×2): 30 mg via INTRAVENOUS
  Administered 2018-12-18: 20 mg via INTRAVENOUS

## 2018-12-18 MED ORDER — FENTANYL CITRATE (PF) 100 MCG/2ML IJ SOLN
INTRAMUSCULAR | Status: DC | PRN
  Administered 2018-12-18 (×2): 50 ug via INTRAVENOUS

## 2018-12-18 MED ORDER — FENTANYL CITRATE (PF) 100 MCG/2ML IJ SOLN
INTRAMUSCULAR | Status: AC
  Filled 2018-12-18: qty 2

## 2018-12-18 NOTE — Anesthesia Post-op Follow-up Note (Signed)
Anesthesia QCDR form completed.        

## 2018-12-18 NOTE — Anesthesia Preprocedure Evaluation (Signed)
Anesthesia Evaluation  Patient identified by MRN, date of birth, ID band Patient awake    Reviewed: Allergy & Precautions, NPO status , Patient's Chart, lab work & pertinent test results, reviewed documented beta blocker date and time   Airway Mallampati: III  TM Distance: >3 FB     Dental  (+) Chipped   Pulmonary asthma , Current Smoker,           Cardiovascular      Neuro/Psych    GI/Hepatic   Endo/Other    Renal/GU      Musculoskeletal   Abdominal   Peds  Hematology   Anesthesia Other Findings Obese.  Reproductive/Obstetrics                             Anesthesia Physical Anesthesia Plan  ASA: III  Anesthesia Plan: General   Post-op Pain Management:    Induction: Intravenous  PONV Risk Score and Plan:   Airway Management Planned:   Additional Equipment:   Intra-op Plan:   Post-operative Plan:   Informed Consent: I have reviewed the patients History and Physical, chart, labs and discussed the procedure including the risks, benefits and alternatives for the proposed anesthesia with the patient or authorized representative who has indicated his/her understanding and acceptance.       Plan Discussed with: CRNA  Anesthesia Plan Comments:         Anesthesia Quick Evaluation

## 2018-12-18 NOTE — H&P (Signed)
Outpatient short stay form Pre-procedure 12/18/2018 12:44 PM Lollie Sails MD  Primary Physician: Dr. Pearline Cables, Suzan Garibaldi, NP  Reason for visit: EGD  History of present illness: Patient is a 38 year old female presenting today for an EGD in regards her history of epigastric and right upper quadrant pain.  She states the pain may begin in the right upper quadrant but will radiate into the left upper quadrant.  She was treated for Helicobacter pylori about 1/2 to 2 months ago.  She has been taking some Protonix milligrams daily.  She also will occasionally take NSAIDs.  He denies use of any asa product or blood thinner.    Current Facility-Administered Medications:  .  0.9 %  sodium chloride infusion, , Intravenous, Continuous, Lollie Sails, MD, Last Rate: 20 mL/hr at 12/18/18 1239 .  0.9 %  sodium chloride infusion, , Intravenous, Continuous, Lollie Sails, MD  Medications Prior to Admission  Medication Sig Dispense Refill Last Dose  . acetaminophen (TYLENOL) 325 MG tablet Take 650 mg by mouth every 6 (six) hours as needed for moderate pain.   Past Week at Unknown time  . cetirizine (ZYRTEC) 10 MG tablet Take 10 mg by mouth daily.   12/17/2018 at Unknown time  . Multiple Vitamin (MULTIVITAMIN WITH MINERALS) TABS tablet Take 1 tablet by mouth daily.   Past Week at Unknown time  . pantoprazole (PROTONIX) 40 MG tablet Take 40 mg by mouth daily.   12/17/2018 at Unknown time  . clarithromycin (BIAXIN) 500 MG tablet Take 500 mg by mouth 2 (two) times daily.    Completed Course at Unknown time  . omeprazole (PRILOSEC) 20 MG capsule Take 20 mg by mouth 2 (two) times daily before a meal.        Allergies  Allergen Reactions  . Doxycycline Hives  . Amoxicillin Swelling  . Penicillins Cross Reactors Swelling    Did it involve swelling of the face/tongue/throat, SOB, or low BP? Unknown Did it involve sudden or severe rash/hives, skin peeling, or any reaction on the inside of your mouth or  nose? Unknown Did you need to seek medical attention at a hospital or doctor's office? Unknown When did it last happen? If all above answers are "NO", may proceed with cephalosporin use.   Sarina Ill [Bactrim] Swelling     Past Medical History:  Diagnosis Date  . Asthma   . Polycystic disease, ovaries     Review of systems:      Physical Exam    Heart and lungs: The rate and rhythm without rub or gallop lungs are bilaterally clear    HEENT: Normocephalic atraumatic eyes are anicteric    Other:    Pertinant exam for procedure: Soft nontender nondistended bowel sounds positive normoactive, obese.    Planned proceedures: EGD and indicated procedures. I have discussed the risks benefits and complications of procedures to include not limited to bleeding, infection, perforation and the risk of sedation and the patient wishes to proceed.   Lollie Sails, MD Gastroenterology 12/18/2018  12:44 PM

## 2018-12-18 NOTE — Op Note (Signed)
[facility] Gastroenterology Patient Name: Ruffin Pyoabitha Lampert Procedure Date: 12/18/2018 12:50 PM MRN: [id number] Account #: [id number] Date of Birth: 08/29/1980 Admit Type: Outpatient Age: [age] Room: [facility] ENDO ROOM 2 Gender: Female Note Status: Finalized Procedure:            Upper GI endoscopy Indications:          Epigastric abdominal pain, Abdominal pain in the right                        upper quadrant, Dyspepsia, Previously treated for                        Helicobacter pylori, Follow-up of Helicobacter pylori Providers:            Christena DeemMartin U. Skulskie, MD Referring MD:         Marin Commentheryl Wells (Referring MD) Medicines:            Monitored Anesthesia Care Complications:        No immediate complications. Procedure:            Pre-Anesthesia Assessment:                       - ASA Grade Assessment: III - A patient with severe                        systemic disease.                       After obtaining informed consent, the endoscope was                        passed under direct vision. Throughout the procedure,                        the patient's blood pressure, pulse, and oxygen                        saturations were monitored continuously. The Endoscope                        was introduced through the mouth, and advanced to the                        third part of duodenum. The upper GI endoscopy was                        accomplished without difficulty. The patient tolerated                        the procedure well. Findings:      The Z-line was irregular. Biopsies were taken with a cold forceps for       histology.      The exam of the esophagus was otherwise normal.      Diffuse and patchy mild inflammation characterized by congestion (edema)       and erythema was found in the gastric body. Biopsies were taken with a       cold forceps for histology. Biopsies were taken with a cold forceps for       Helicobacter pylori testing.      The cardia and gastric  fundus were normal on retroflexion.      The examined duodenum was normal. Impression:           - Z-line irregular. Biopsied.                       - Bile gastritis. Biopsied.                       - Normal examined duodenum. Recommendation:       - Await pathology results.                       - Use Protonix (pantoprazole) 40 mg PO daily daily. Procedure Code(s):    --- Professional ---                       [phone], Esophagogastroduodenoscopy, flexible, transoral;                        with biopsy, single or multiple Diagnosis Code(s):    --- Professional ---                       K22.8, Other specified diseases of esophagus                       K29.60, Other gastritis without bleeding                       R10.13, Epigastric pain                       R10.11, Right upper quadrant pain                       V95.63, Helicobacter pylori [H. pylori] as the cause of                        diseases classified elsewhere CPT copyright 2019 American Medical Association. All rights reserved. The codes documented in this report are preliminary and upon coder review may  be revised to meet current compliance requirements. Lollie Sails, MD 12/18/2018 1:09:58 PM This report has been signed electronically. Number of Addenda: 0 Note Initiated On: 12/18/2018 12:50 PM      [facility]

## 2018-12-18 NOTE — Transfer of Care (Signed)
Immediate Anesthesia Transfer of Care Note  Patient: Candace Cummings  Procedure(s) Performed: ESOPHAGOGASTRODUODENOSCOPY (EGD) WITH PROPOFOL (N/A )  Patient Location: PACU and Endoscopy Unit  Anesthesia Type:General  Level of Consciousness: awake, alert , oriented and patient cooperative  Airway & Oxygen Therapy: Patient Spontanous Breathing  Post-op Assessment: Report given to RN, Post -op Vital signs reviewed and stable and Patient moving all extremities  Post vital signs: Reviewed and stable  Last Vitals:  Vitals Value Taken Time  BP 139/87 12/18/18 1312  Temp 36.2 C 12/18/18 1312  Pulse 79 12/18/18 1314  Resp 19 12/18/18 1314  SpO2 96 % 12/18/18 1314  Vitals shown include unvalidated device data.  Last Pain:  Vitals:   12/18/18 1312  TempSrc: Tympanic  PainSc: 0-No pain         Complications: No apparent anesthesia complications

## 2018-12-18 NOTE — Anesthesia Postprocedure Evaluation (Signed)
Anesthesia Post Note  Patient: Candace Cummings  Procedure(s) Performed: ESOPHAGOGASTRODUODENOSCOPY (EGD) WITH PROPOFOL (N/A )  Patient location during evaluation: Endoscopy Anesthesia Type: General Level of consciousness: awake and alert Pain management: pain level controlled Vital Signs Assessment: post-procedure vital signs reviewed and stable Respiratory status: spontaneous breathing, nonlabored ventilation, respiratory function stable and patient connected to nasal cannula oxygen Cardiovascular status: blood pressure returned to baseline and stable Postop Assessment: no apparent nausea or vomiting Anesthetic complications: no     Last Vitals:  Vitals:   12/18/18 1215 12/18/18 1312  BP: 122/86 139/87  Pulse: 77 80  Resp: 18 (!) 22  Temp: (!) 36 C (!) 36.2 C  SpO2: 99% 94%    Last Pain:  Vitals:   12/18/18 1342  TempSrc:   PainSc: 0-No pain                 Rockland Kotarski S

## 2018-12-19 ENCOUNTER — Encounter: Payer: Self-pay | Admitting: Gastroenterology

## 2018-12-20 LAB — SURGICAL PATHOLOGY

## 2018-12-28 ENCOUNTER — Other Ambulatory Visit: Payer: Self-pay | Admitting: Gastroenterology

## 2018-12-28 DIAGNOSIS — R112 Nausea with vomiting, unspecified: Secondary | ICD-10-CM

## 2019-01-04 ENCOUNTER — Ambulatory Visit
Admission: RE | Admit: 2019-01-04 | Discharge: 2019-01-04 | Disposition: A | Discharge status: Home / Self Care | Payer: BC Managed Care – PPO | Source: Ambulatory Visit | Attending: Gastroenterology | Admitting: Gastroenterology

## 2019-01-04 ENCOUNTER — Encounter
Admission: RE | Admit: 2019-01-04 | Discharge: 2019-01-04 | Disposition: A | Discharge status: Home / Self Care | Payer: BC Managed Care – PPO | Source: Ambulatory Visit | Attending: Gastroenterology | Admitting: Gastroenterology

## 2019-01-04 ENCOUNTER — Other Ambulatory Visit: Payer: Self-pay | Admitting: Gastroenterology

## 2019-01-04 ENCOUNTER — Other Ambulatory Visit: Payer: Self-pay

## 2019-01-04 DIAGNOSIS — R112 Nausea with vomiting, unspecified: Secondary | ICD-10-CM

## 2019-01-04 MED ORDER — TECHNETIUM TC 99M MEBROFENIN IV KIT
5.4200 | PACK | Freq: Once | INTRAVENOUS | Status: AC | PRN
  Administered 2019-01-04: 5.42 via INTRAVENOUS

## 2019-01-15 ENCOUNTER — Other Ambulatory Visit
Admission: RE | Admit: 2019-01-15 | Discharge: 2019-01-15 | Disposition: A | Discharge status: Home / Self Care | Payer: BC Managed Care – PPO | Source: Ambulatory Visit | Attending: Gastroenterology | Admitting: Gastroenterology

## 2019-01-15 DIAGNOSIS — R1084 Generalized abdominal pain: Secondary | ICD-10-CM | POA: Diagnosis not present

## 2019-01-15 DIAGNOSIS — R197 Diarrhea, unspecified: Secondary | ICD-10-CM | POA: Insufficient documentation

## 2019-01-15 DIAGNOSIS — R1013 Epigastric pain: Secondary | ICD-10-CM | POA: Diagnosis not present

## 2019-01-22 ENCOUNTER — Other Ambulatory Visit
Admission: RE | Admit: 2019-01-22 | Discharge: 2019-01-22 | Disposition: A | Discharge status: Home / Self Care | Payer: BC Managed Care – PPO | Source: Ambulatory Visit | Attending: Gastroenterology | Admitting: Gastroenterology

## 2019-01-22 DIAGNOSIS — R197 Diarrhea, unspecified: Secondary | ICD-10-CM | POA: Insufficient documentation

## 2019-01-22 LAB — GASTROINTESTINAL PANEL BY PCR, STOOL (REPLACES STOOL CULTURE)

## 2019-01-22 LAB — C DIFFICILE QUICK SCREEN W PCR REFLEX
C Diff antigen: POSITIVE — AB
C Diff toxin: NEGATIVE

## 2019-01-22 LAB — CLOSTRIDIUM DIFFICILE BY PCR, REFLEXED: Toxigenic C. Difficile by PCR: POSITIVE — AB

## 2019-01-25 LAB — H. PYLORI ANTIGEN, STOOL: H. Pylori Stool Ag, Eia: NEGATIVE

## 2019-01-31 LAB — CALPROTECTIN, FECAL: Calprotectin, Fecal: 54 ug/g (ref 0–120)

## 2019-02-15 ENCOUNTER — Other Ambulatory Visit: Admission: RE | Admit: 2019-02-15 | Payer: BC Managed Care – PPO | Source: Ambulatory Visit

## 2019-02-19 ENCOUNTER — Ambulatory Visit
Admission: RE | Admit: 2019-02-19 | Payer: BC Managed Care – PPO | Source: Home / Self Care | Admitting: Gastroenterology

## 2019-02-19 ENCOUNTER — Encounter: Admission: RE | Payer: Self-pay | Source: Home / Self Care

## 2019-02-19 SURGERY — COLONOSCOPY WITH PROPOFOL
Anesthesia: General

## 2019-10-02 ENCOUNTER — Encounter: Payer: Self-pay | Admitting: Emergency Medicine

## 2019-10-02 ENCOUNTER — Emergency Department
Admission: EM | Admit: 2019-10-02 | Discharge: 2019-10-02 | Disposition: A | Discharge status: Home / Self Care | Payer: BC Managed Care – PPO | Attending: Emergency Medicine | Admitting: Emergency Medicine

## 2019-10-02 ENCOUNTER — Other Ambulatory Visit: Payer: Self-pay

## 2019-10-02 DIAGNOSIS — M545 Low back pain: Secondary | ICD-10-CM | POA: Diagnosis present

## 2019-10-02 DIAGNOSIS — Z79899 Other long term (current) drug therapy: Secondary | ICD-10-CM | POA: Insufficient documentation

## 2019-10-02 DIAGNOSIS — J45909 Unspecified asthma, uncomplicated: Secondary | ICD-10-CM | POA: Insufficient documentation

## 2019-10-02 DIAGNOSIS — M5441 Lumbago with sciatica, right side: Secondary | ICD-10-CM | POA: Insufficient documentation

## 2019-10-02 DIAGNOSIS — F17210 Nicotine dependence, cigarettes, uncomplicated: Secondary | ICD-10-CM | POA: Insufficient documentation

## 2019-10-02 LAB — URINALYSIS, COMPLETE (UACMP) WITH MICROSCOPIC
Bacteria, UA: NONE SEEN
Bilirubin Urine: NEGATIVE
Glucose, UA: NEGATIVE mg/dL
Hgb urine dipstick: NEGATIVE
Ketones, ur: NEGATIVE mg/dL
Leukocytes,Ua: NEGATIVE
Nitrite: NEGATIVE
Protein, ur: NEGATIVE mg/dL
Specific Gravity, Urine: 1.025 (ref 1.005–1.030)
pH: 6 (ref 5.0–8.0)

## 2019-10-02 LAB — POCT PREGNANCY, URINE: Preg Test, Ur: NEGATIVE

## 2019-10-02 MED ORDER — KETOROLAC TROMETHAMINE 30 MG/ML IJ SOLN
30.0000 mg | Freq: Once | INTRAMUSCULAR | Status: AC
  Administered 2019-10-02: 18:00:00 30 mg via INTRAMUSCULAR
  Filled 2019-10-02: qty 1

## 2019-10-02 MED ORDER — PREDNISONE 10 MG (21) PO TBPK: ORAL_TABLET | ORAL | 0 refills | Status: DC

## 2019-10-02 MED ORDER — METHOCARBAMOL 500 MG PO TABS: 500.0000 mg | ORAL_TABLET | Freq: Three times a day (TID) | ORAL | 0 refills | Status: AC | PRN

## 2019-10-02 MED ORDER — PREDNISONE 20 MG PO TABS
60.0000 mg | ORAL_TABLET | Freq: Once | ORAL | Status: AC
  Administered 2019-10-02: 60 mg via ORAL
  Filled 2019-10-02: qty 3

## 2019-10-02 NOTE — ED Provider Notes (Signed)
Emergency Department Provider Note  ____________________________________________  Time seen: Approximately 3:28 PM  I have reviewed the triage vital signs and the nursing notes.   HISTORY  Chief Complaint Back Pain   Historian Patient     HPI Candace Cummings is a 39 y.o. female presents to the emergency department with right-sided low back pain that radiates along the posterior aspect of her right leg.  Patient describes sensation as a numbness and tingling.  She has been able to ambulate at home.  She denies bowel or bladder incontinence or saddle anesthesia.  No fever.  Denies dysuria, hematuria or increased urinary frequency.  Patient was referred by [facility] to the emergency department as they were concerned by her complaints of "numbness".   Past Medical History:  Diagnosis Date  . Asthma   . Polycystic disease, ovaries      Immunizations up to date:  Yes.     Past Medical History:  Diagnosis Date  . Asthma   . Polycystic disease, ovaries     There are no problems to display for this patient.   Past Surgical History:  Procedure Laterality Date  . ESOPHAGOGASTRODUODENOSCOPY (EGD) WITH PROPOFOL N/A 12/18/2018   Procedure: ESOPHAGOGASTRODUODENOSCOPY (EGD) WITH PROPOFOL;  Surgeon: Christena Deem, MD;  Location: [facility] ENDOSCOPY;  Service: Endoscopy;  Laterality: N/A;    Prior to Admission medications   Medication Sig Start Date End Date Taking? Authorizing Provider  cetirizine (ZYRTEC) 10 MG tablet Take 10 mg by mouth daily.   Yes [provider]  Multiple Vitamin (MULTIVITAMIN WITH MINERALS) TABS tablet Take 1 tablet by mouth daily.   Yes [provider]  pantoprazole (PROTONIX) 40 MG tablet Take 40 mg by mouth daily.   Yes [provider]  acetaminophen (TYLENOL) 325 MG tablet Take 650 mg by mouth every 6 (six) hours as needed for moderate pain.    [provider]  clarithromycin (BIAXIN) 500 MG tablet Take 500 mg  by mouth 2 (two) times daily.  11/02/18   [provider]  methocarbamol (ROBAXIN) 500 MG tablet Take 1 tablet (500 mg total) by mouth every 8 (eight) hours as needed for up to 5 days. 10/02/19 10/07/19  Orvil Feil, PA-C  omeprazole (PRILOSEC) 20 MG capsule Take 20 mg by mouth 2 (two) times daily before a meal. 11/02/18   [provider]  predniSONE (STERAPRED UNI-PAK 21 TAB) 10 MG (21) TBPK tablet Take 6 tablets the first day, take 5 tablets the second day, take 4 tablets the third day, take 3 tablets the fourth day, take 2 tablets the fifth day, take 1 tablet the sixth day. 10/02/19   Orvil Feil, PA-C    Allergies Doxycycline, Amoxicillin, Penicillins cross reactors, and Septra [bactrim]  Family History  Problem Relation Age of Onset  . Healthy Mother   . Healthy Father     Social History Social History   Tobacco Use  . Smoking status: Current Every Day Smoker    Packs/day: 1.00  . Smokeless tobacco: Never Used  Substance Use Topics  . Alcohol use: No  . Drug use: No     Review of Systems  Constitutional: No fever/chills Eyes:  No discharge ENT: No upper respiratory complaints. Respiratory: no cough. No SOB/ use of accessory muscles to breath Gastrointestinal:   No nausea, no vomiting.  No diarrhea.  No constipation. Musculoskeletal: Patient has low back pain.  Skin: Negative for rash, abrasions, lacerations, ecchymosis.    ___________________________________________  PHYSICAL EXAM:  VITAL SIGNS: ED Triage Vitals  Enc Vitals Group     BP 10/02/19 1252 126/76     Pulse Rate 10/02/19 1252 72     Resp 10/02/19 1252 20     Temp 10/02/19 1252 98.7 F (37.1 C)     Temp Source 10/02/19 1252 Oral     SpO2 10/02/19 1252 97 %     Weight 10/02/19 1253 265 lb (120.2 kg)     Height 10/02/19 1253 5\' 8"  (1.727 m)     Head Circumference --      Peak Flow --      Pain Score 10/02/19 1309 10     Pain Loc --      Pain Edu? --      Excl. in GC? --       Constitutional: Alert and oriented. Well appearing and in no acute distress. Eyes: Conjunctivae are normal. PERRL. EOMI. Head: Atraumatic. Cardiovascular: Normal rate, regular rhythm. Normal S1 and S2.  Good peripheral circulation. Respiratory: Normal respiratory effort without tachypnea or retractions. Lungs CTAB. Good air entry to the bases with no decreased or absent breath sounds Gastrointestinal: Bowel sounds x 4 quadrants. Soft and nontender to palpation. No guarding or rigidity. No distention.  No CVA tenderness. Musculoskeletal: Patient has 5 out of 5 strength in the lower extremities.  Patient has paraspinal muscle tenderness to palpation along the lumbar spine on the right.  Positive straight leg raise on the right. Neurologic:  Normal for age. No gross focal neurologic deficits are appreciated.  Skin:  Skin is warm, dry and intact. No rash noted. Psychiatric: Mood and affect are normal for age. Speech and behavior are normal.   ____________________________________________   LABS (all labs ordered are listed, but only abnormal results are displayed)  Labs Reviewed  URINALYSIS, COMPLETE (UACMP) WITH MICROSCOPIC - Abnormal; Notable for the following components:      Result Value   Color, Urine YELLOW (*)    APPearance CLEAR (*)    All other components within normal limits  POC URINE PREG, ED  POCT PREGNANCY, URINE   ____________________________________________  EKG   ____________________________________________  RADIOLOGY   No results found.  ____________________________________________    PROCEDURES  Procedure(s) performed:     Procedures     Medications  ketorolac (TORADOL) 30 MG/ML injection 30 mg (30 mg Intramuscular Given 10/02/19 1741)  predniSONE (DELTASONE) tablet 60 mg (60 mg Oral Given 10/02/19 1741)     ____________________________________________   INITIAL IMPRESSION / ASSESSMENT AND PLAN / ED COURSE  Pertinent labs & imaging  results that were available during my care of the patient were reviewed by me and considered in my medical decision making (see chart for details).      Assessment and Plan: Back pain:  39 year old female presents to the emergency department with right-sided low back pain that radiates down the posterior aspect of her leg.  Vital signs are reassuring at triage.  On physical exam, patient had symmetric strength and paraspinal muscle tenderness along the lumbar spine.  Urinalysis revealed no evidence of cystitis.  Urine pregnancy testing was negative.  We will treat radicular pain with prednisone and Robaxin as patient has tried anti-inflammatories at home.  She was advised to follow-up with neurosurgery if her pain persist.  Return precautions were given to return with new or worsening symptoms.   ____________________________________________  FINAL CLINICAL IMPRESSION(S) / ED DIAGNOSES  Final diagnoses:  Acute right-sided low back pain with right-sided sciatica  NEW MEDICATIONS STARTED DURING THIS VISIT:  ED Discharge Orders         Ordered    predniSONE (STERAPRED UNI-PAK 21 TAB) 10 MG (21) TBPK tablet     10/02/19 1841    methocarbamol (ROBAXIN) 500 MG tablet  Every 8 hours PRN     10/02/19 1841              This chart was dictated using voice recognition software/Dragon. Despite best efforts to proofread, errors can occur which can change the meaning. Any change was purely unintentional.     Lannie Fields, PA-C 10/02/19 1845    Carrie Mew, MD 10/03/19 2337

## 2019-10-02 NOTE — ED Notes (Signed)
Says low back pain that shoots pain down both legs for a week.  No injury recalled.  Says it does get worse when she gets up.  Has been taking tylenol without releif

## 2019-10-02 NOTE — ED Triage Notes (Signed)
Pt reports pain across her lower back for the past week. Pt denies any injuries, heavy lifting or twisting. Pt denies other sx's such as fever or urinary sx's. Pt reports sometimes the pain send tingling down her legs.

## 2019-10-02 NOTE — Discharge Instructions (Signed)
Take prednisone as prescribed for the next week. You can take Robaxin before bed.

## 2019-10-14 ENCOUNTER — Other Ambulatory Visit: Payer: Self-pay | Admitting: Family Medicine

## 2019-10-14 ENCOUNTER — Other Ambulatory Visit (HOSPITAL_COMMUNITY): Payer: Self-pay | Admitting: Family Medicine

## 2019-10-14 DIAGNOSIS — M5416 Radiculopathy, lumbar region: Secondary | ICD-10-CM

## 2019-10-14 DIAGNOSIS — M5412 Radiculopathy, cervical region: Secondary | ICD-10-CM

## 2019-10-14 DIAGNOSIS — M5030 Other cervical disc degeneration, unspecified cervical region: Secondary | ICD-10-CM

## 2019-10-15 ENCOUNTER — Ambulatory Visit: Payer: BC Managed Care – PPO

## 2019-10-15 IMAGING — US ULTRASOUND ABDOMEN COMPLETE
1 series · 13 of 25 positions shown · non-contrast
Comparison: CT scan of November 06, 2018.

CLINICAL DATA: Nausea, vomiting.

EXAM:
ABDOMEN ULTRASOUND COMPLETE

[Series 1: ultrasound abdomen complete · 13 of 116 slices shown]
[im 1/116]
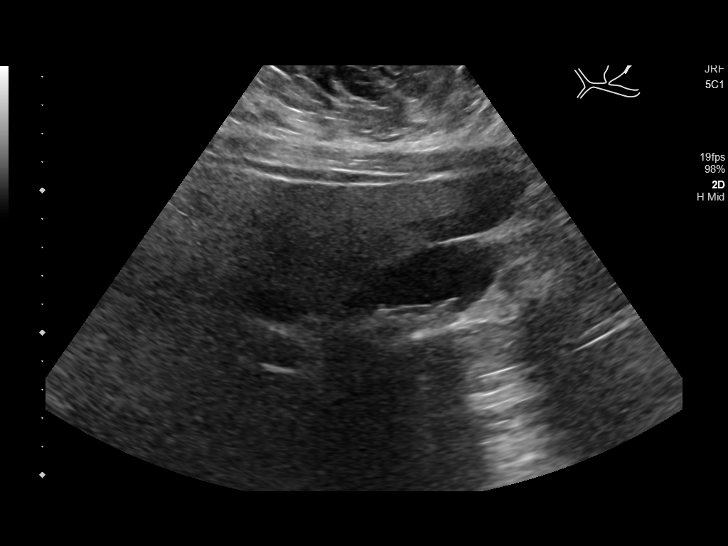
[im 10/116]
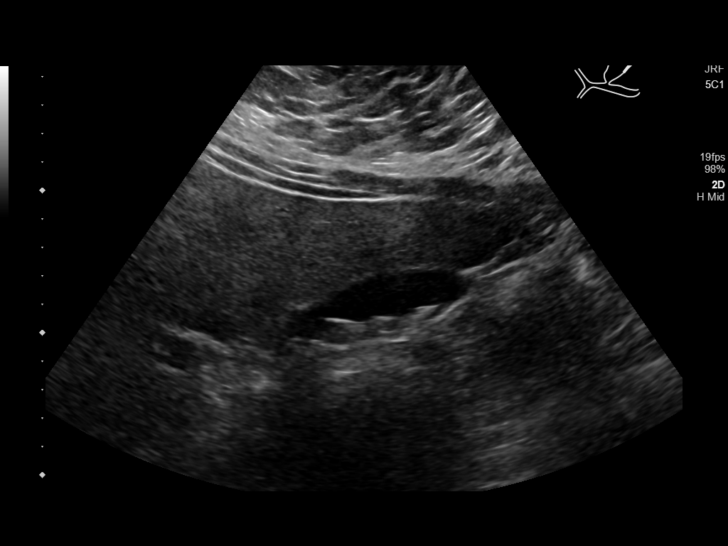
[im 20/116]
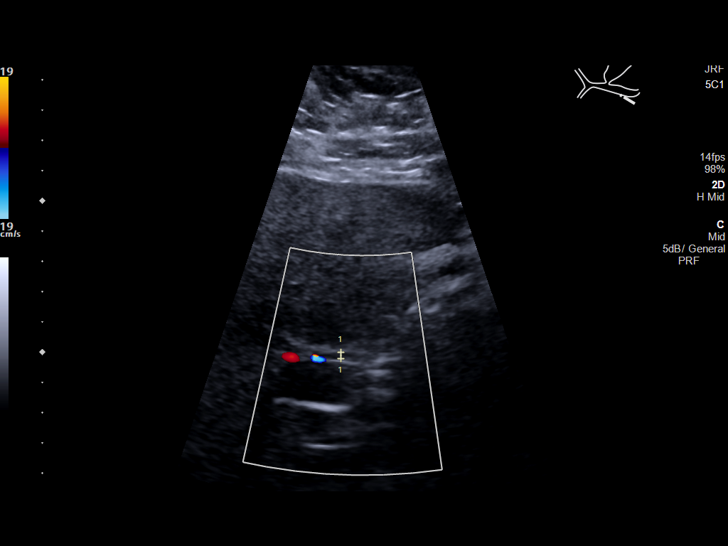
[im 29/116]
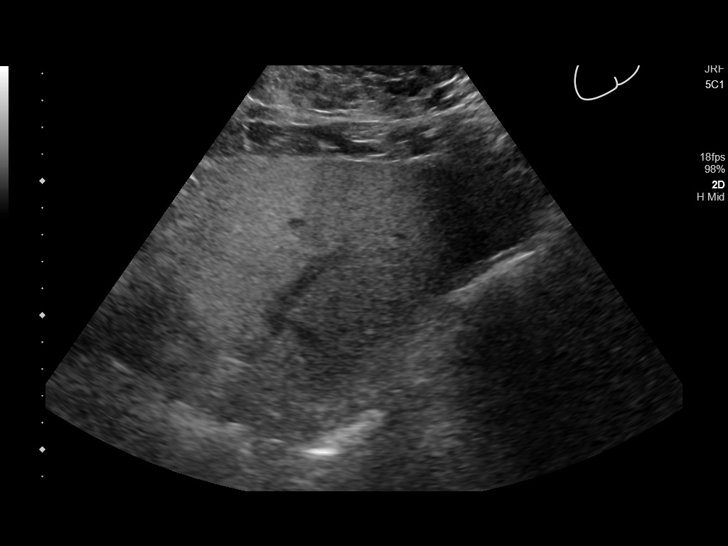
[im 39/116]
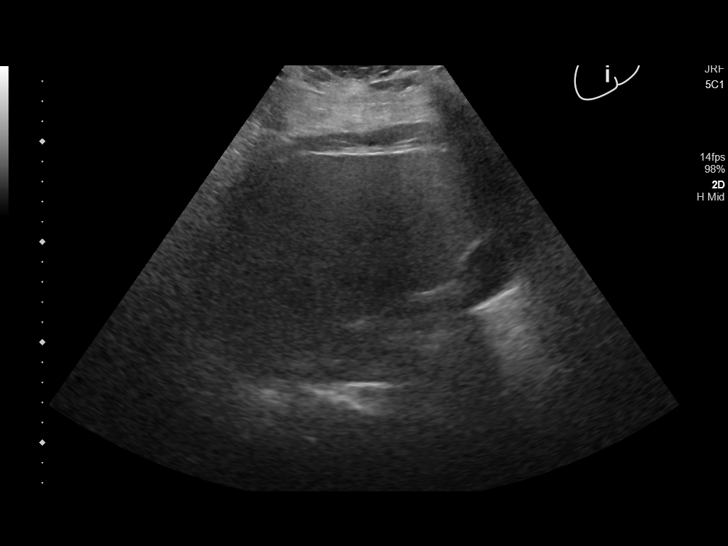
[im 48/116]
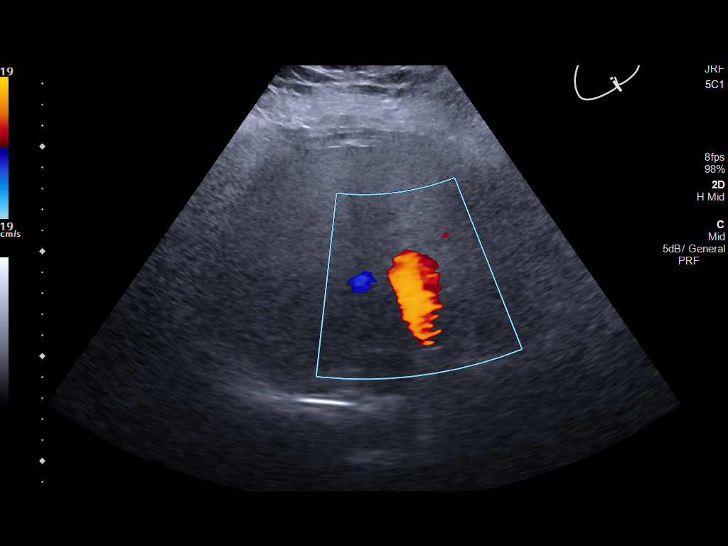
[im 58/116]
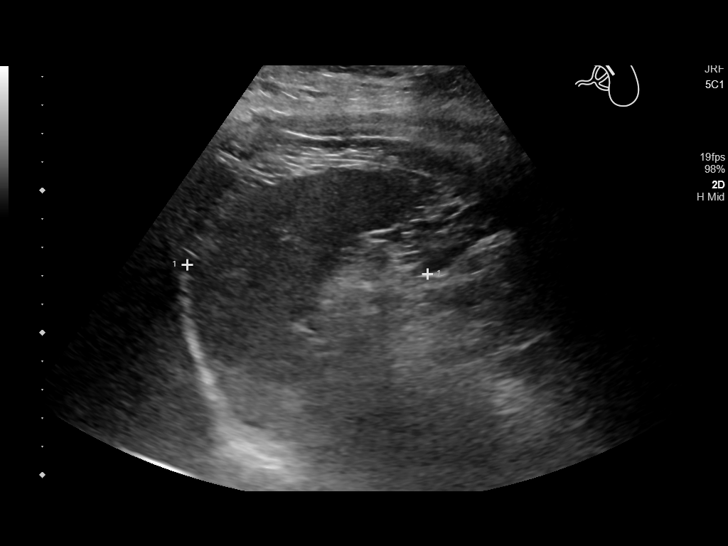
[im 68/116]
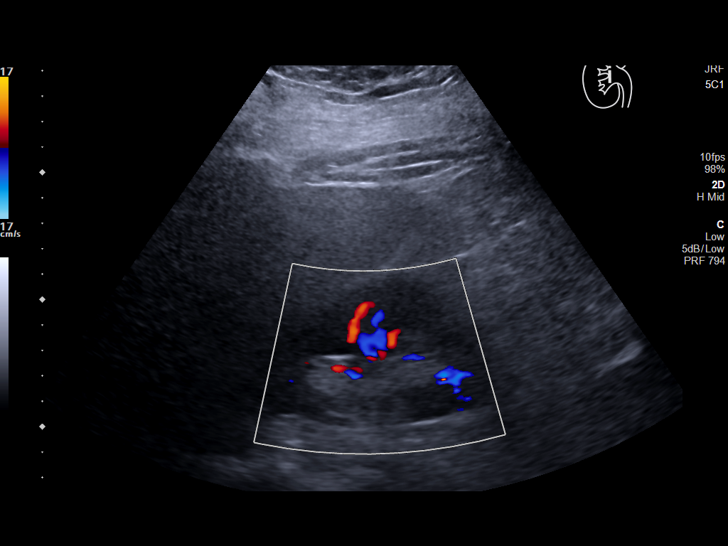
[im 77/116]
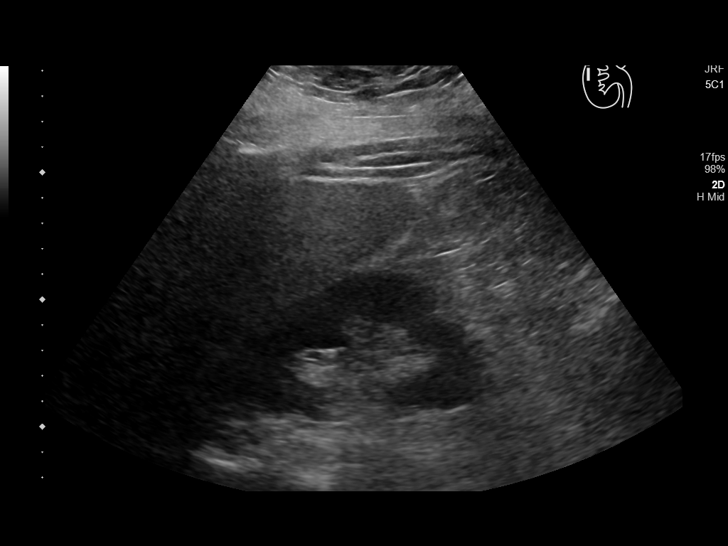
[im 87/116]
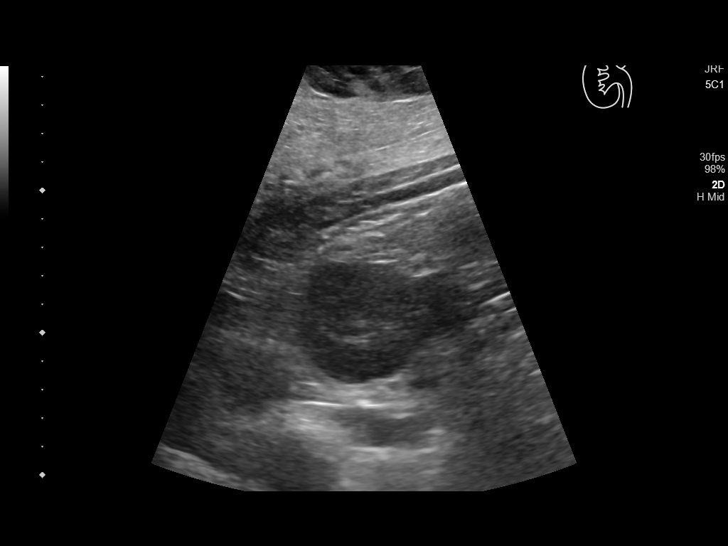
[im 96/116]
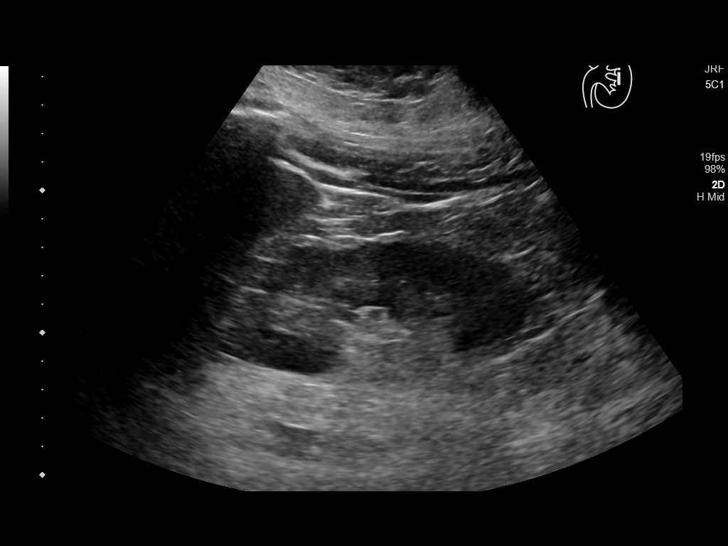
[im 106/116]
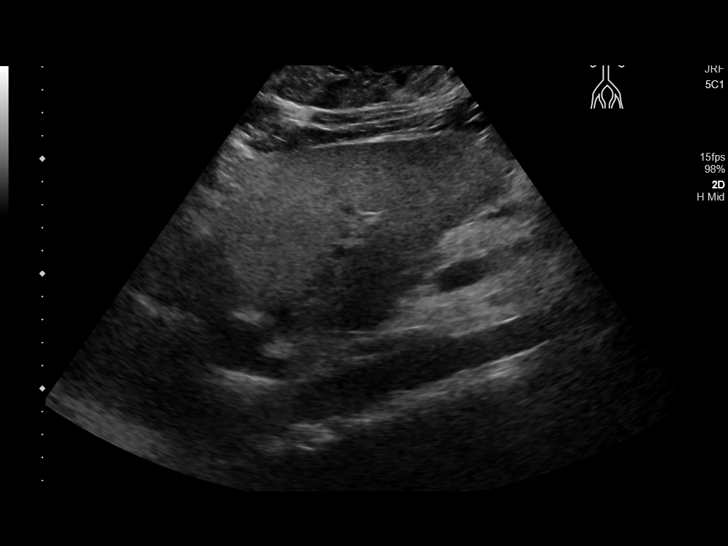
[im 116/116]
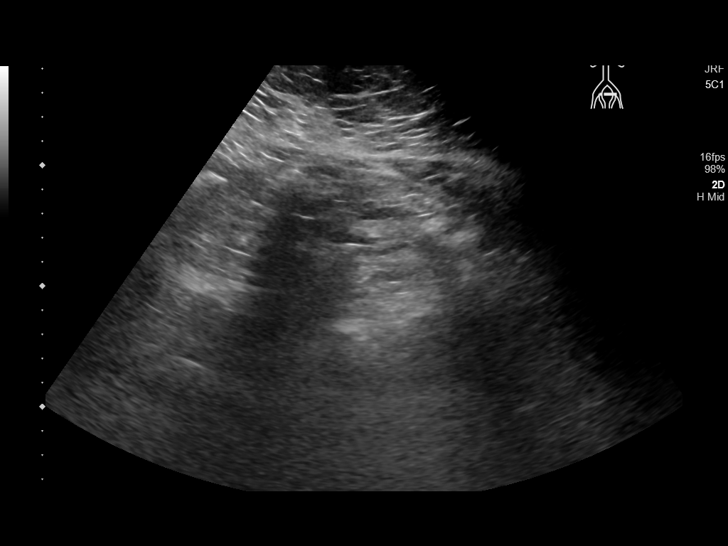

[13 of 25 positions shown; findings below may reference images not displayed]

FINDINGS: Gallbladder: Cholelithiasis is noted without gallbladder wall
thickening or pericholecystic fluid. No sonographic Murphy's sign is
noted.

Common bile duct: Diameter: 2 mm which is within normal limits.

Liver: No focal lesion identified. Increased echogenicity of hepatic
parenchyma is noted consistent with hepatic steatosis. Portal vein
is patent on color Doppler imaging with normal direction of blood
flow towards the liver.

IVC: No abnormality visualized.

Pancreas: Visualized portion unremarkable.

Spleen: Size and appearance within normal limits.

Right Kidney: Length: 11.2 cm. Echogenicity within normal limits. No
mass or hydronephrosis visualized.

Left Kidney: Length: 12.1 cm. Echogenicity within normal limits.
cm hyperechoic focus is seen in lower pole most consistent with
renal angiomyolipoma. No hydronephrosis visualized.

Abdominal aorta: No aneurysm visualized.

Other findings: None.
IMPRESSION: Cholelithiasis without evidence of cholecystitis.

Hepatic steatosis.

1.5 cm hyperechoic focus seen in lower pole of left kidney most
consistent with renal angiomyolipoma.

## 2019-10-16 ENCOUNTER — Other Ambulatory Visit: Payer: Self-pay

## 2019-10-16 ENCOUNTER — Ambulatory Visit
Admission: RE | Admit: 2019-10-16 | Discharge: 2019-10-16 | Disposition: A | Discharge status: Home / Self Care | Payer: Self-pay | Source: Ambulatory Visit | Attending: Family Medicine | Admitting: Family Medicine

## 2019-10-16 DIAGNOSIS — M5412 Radiculopathy, cervical region: Secondary | ICD-10-CM | POA: Insufficient documentation

## 2019-10-16 DIAGNOSIS — M5030 Other cervical disc degeneration, unspecified cervical region: Secondary | ICD-10-CM | POA: Insufficient documentation

## 2020-12-05 ENCOUNTER — Emergency Department (HOSPITAL_COMMUNITY)
Admission: EM | Admit: 2020-12-05 | Discharge: 2020-12-05 | Disposition: A | Discharge status: Home / Self Care | Payer: BC Managed Care – PPO | Attending: Emergency Medicine | Admitting: Emergency Medicine

## 2020-12-05 ENCOUNTER — Encounter (HOSPITAL_COMMUNITY): Payer: Self-pay

## 2020-12-05 DIAGNOSIS — I10 Essential (primary) hypertension: Secondary | ICD-10-CM | POA: Insufficient documentation

## 2020-12-05 DIAGNOSIS — J45909 Unspecified asthma, uncomplicated: Secondary | ICD-10-CM | POA: Insufficient documentation

## 2020-12-05 DIAGNOSIS — R1013 Epigastric pain: Secondary | ICD-10-CM | POA: Insufficient documentation

## 2020-12-05 DIAGNOSIS — F17210 Nicotine dependence, cigarettes, uncomplicated: Secondary | ICD-10-CM | POA: Insufficient documentation

## 2020-12-05 LAB — CBC WITH DIFFERENTIAL/PLATELET
Abs Immature Granulocytes: 0.2 10*3/uL — ABNORMAL HIGH (ref 0.00–0.07)
Basophils Absolute: 0.1 10*3/uL (ref 0.0–0.1)
Basophils Relative: 1 %
Eosinophils Absolute: 0.3 10*3/uL (ref 0.0–0.5)
Eosinophils Relative: 2 %
HCT: 39.9 % (ref 36.0–46.0)
Hemoglobin: 13.8 g/dL (ref 12.0–15.0)
Immature Granulocytes: 1 %
Lymphocytes Relative: 21 %
Lymphs Abs: 3.2 10*3/uL (ref 0.7–4.0)
MCH: 31.9 pg (ref 26.0–34.0)
MCHC: 34.6 g/dL (ref 30.0–36.0)
MCV: 92.1 fL (ref 80.0–100.0)
Monocytes Absolute: 0.8 10*3/uL (ref 0.1–1.0)
Monocytes Relative: 5 %
Neutro Abs: 10.8 10*3/uL — ABNORMAL HIGH (ref 1.7–7.7)
Neutrophils Relative %: 70 %
Platelets: 291 10*3/uL (ref 150–400)
RBC: 4.33 MIL/uL (ref 3.87–5.11)
RDW: 12.2 % (ref 11.5–15.5)
WBC: 15.3 10*3/uL — ABNORMAL HIGH (ref 4.0–10.5)
nRBC: 0 % (ref 0.0–0.2)

## 2020-12-05 LAB — LIPASE, BLOOD: Lipase: 29 U/L (ref 11–51)

## 2020-12-05 LAB — COMPREHENSIVE METABOLIC PANEL
ALT: 28 U/L (ref 0–44)
AST: 21 U/L (ref 15–41)
Albumin: 4.1 g/dL (ref 3.5–5.0)
Alkaline Phosphatase: 45 U/L (ref 38–126)
Anion gap: 8 (ref 5–15)
BUN: 10 mg/dL (ref 6–20)
CO2: 21 mmol/L — ABNORMAL LOW (ref 22–32)
Calcium: 9 mg/dL (ref 8.9–10.3)
Chloride: 107 mmol/L (ref 98–111)
Creatinine, Ser: 0.63 mg/dL (ref 0.44–1.00)
GFR, Estimated: >60 mL/min (ref >60)
Glucose, Bld: 240 mg/dL — ABNORMAL HIGH (ref 70–99)
Potassium: 3.7 mmol/L (ref 3.5–5.1)
Sodium: 136 mmol/L (ref 135–145)
Total Bilirubin: 0.6 mg/dL (ref 0.3–1.2)
Total Protein: 7.2 g/dL (ref 6.5–8.1)

## 2020-12-05 MED ORDER — LORAZEPAM 1 MG PO TABS
1.0000 mg | ORAL_TABLET | Freq: Once | ORAL | Status: AC
  Administered 2020-12-05: 1 mg via ORAL
  Filled 2020-12-05: qty 1

## 2020-12-05 MED ORDER — DICYCLOMINE HCL 10 MG PO CAPS
10.0000 mg | ORAL_CAPSULE | Freq: Once | ORAL | Status: AC
  Administered 2020-12-05: 10 mg via ORAL
  Filled 2020-12-05: qty 1

## 2020-12-05 MED ORDER — DICYCLOMINE HCL 20 MG PO TABS: 20.0000 mg | ORAL_TABLET | Freq: Two times a day (BID) | ORAL | 0 refills | Status: AC | PRN
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
## 2020-12-05 NOTE — ED Notes (Signed)
Urine specimen unable to be obtained at this time. Will request later.

## 2020-12-05 NOTE — ED Notes (Signed)
Pt ambulatory to restroom w/out assistance. Pt instructed to provide urine sample if possible.

## 2020-12-23 ENCOUNTER — Encounter (HOSPITAL_COMMUNITY): Payer: Self-pay

## 2020-12-23 ENCOUNTER — Other Ambulatory Visit: Payer: Self-pay

## 2020-12-23 ENCOUNTER — Emergency Department (HOSPITAL_COMMUNITY)
Admission: EM | Admit: 2020-12-23 | Discharge: 2020-12-23 | Disposition: A | Discharge status: Home / Self Care | Payer: BC Managed Care – PPO | Attending: Emergency Medicine | Admitting: Emergency Medicine

## 2020-12-23 DIAGNOSIS — F17210 Nicotine dependence, cigarettes, uncomplicated: Secondary | ICD-10-CM | POA: Diagnosis not present

## 2020-12-23 DIAGNOSIS — I10 Essential (primary) hypertension: Secondary | ICD-10-CM | POA: Diagnosis not present

## 2020-12-23 DIAGNOSIS — U071 COVID-19: Secondary | ICD-10-CM

## 2020-12-23 DIAGNOSIS — Z28310 Unvaccinated for covid-19: Secondary | ICD-10-CM | POA: Insufficient documentation

## 2020-12-23 DIAGNOSIS — J45909 Unspecified asthma, uncomplicated: Secondary | ICD-10-CM | POA: Diagnosis not present

## 2020-12-23 DIAGNOSIS — R531 Weakness: Secondary | ICD-10-CM | POA: Diagnosis present

## 2020-12-23 LAB — CBC WITH DIFFERENTIAL/PLATELET
Abs Immature Granulocytes: 0.11 10*3/uL — ABNORMAL HIGH (ref 0.00–0.07)
Basophils Absolute: 0.1 10*3/uL (ref 0.0–0.1)
Basophils Relative: 1 %
Eosinophils Absolute: 0.2 10*3/uL (ref 0.0–0.5)
Eosinophils Relative: 2 %
HCT: 43 % (ref 36.0–46.0)
Hemoglobin: 14.9 g/dL (ref 12.0–15.0)
Immature Granulocytes: 1 %
Lymphocytes Relative: 4 %
Lymphs Abs: 0.5 10*3/uL — ABNORMAL LOW (ref 0.7–4.0)
MCH: 32 pg (ref 26.0–34.0)
MCHC: 34.7 g/dL (ref 30.0–36.0)
MCV: 92.5 fL (ref 80.0–100.0)
Monocytes Absolute: 0.6 10*3/uL (ref 0.1–1.0)
Monocytes Relative: 5 %
Neutro Abs: 10.5 10*3/uL — ABNORMAL HIGH (ref 1.7–7.7)
Neutrophils Relative %: 87 %
Platelets: 271 10*3/uL (ref 150–400)
RBC: 4.65 MIL/uL (ref 3.87–5.11)
RDW: 11.9 % (ref 11.5–15.5)
WBC: 12.1 10*3/uL — ABNORMAL HIGH (ref 4.0–10.5)
nRBC: 0 % (ref 0.0–0.2)

## 2020-12-23 LAB — RESP PANEL BY RT-PCR (FLU A&B, COVID) ARPGX2
Influenza A by PCR: NEGATIVE
Influenza B by PCR: NEGATIVE
SARS Coronavirus 2 by RT PCR: POSITIVE — AB

## 2020-12-23 LAB — BASIC METABOLIC PANEL
Anion gap: 10 (ref 5–15)
BUN: 10 mg/dL (ref 6–20)
CO2: 24 mmol/L (ref 22–32)
Calcium: 9.6 mg/dL (ref 8.9–10.3)
Chloride: 102 mmol/L (ref 98–111)
Creatinine, Ser: 0.6 mg/dL (ref 0.44–1.00)
GFR, Estimated: >60 mL/min (ref >60)
Glucose, Bld: 184 mg/dL — ABNORMAL HIGH (ref 70–99)
Potassium: 4.1 mmol/L (ref 3.5–5.1)
Sodium: 136 mmol/L (ref 135–145)

## 2020-12-23 LAB — I-STAT BETA HCG BLOOD, ED (MC, WL, AP ONLY): I-stat hCG, quantitative: <5 m[IU]/mL (ref <5)

## 2020-12-23 MED ORDER — NIRMATRELVIR/RITONAVIR (PAXLOVID)TABLET: 3.0000 | ORAL_TABLET | Freq: Two times a day (BID) | ORAL | 0 refills | Status: AC

## 2020-12-23 MED ORDER — ALBUTEROL SULFATE HFA 108 (90 BASE) MCG/ACT IN AERS: 1.0000 | INHALATION_SPRAY | Freq: Four times a day (QID) | RESPIRATORY_TRACT | 1 refills | Status: AC | PRN

## 2020-12-23 MED ORDER — ALBUTEROL SULFATE HFA 108 (90 BASE) MCG/ACT IN AERS: 2.0000 | INHALATION_SPRAY | Freq: Four times a day (QID) | RESPIRATORY_TRACT | 1 refills | Status: DC | PRN

## 2020-12-23 NOTE — ED Provider Notes (Signed)
[facility]-EMERGENCY DEPT Provider Note   CSN: [id number] Arrival date & time: 12/23/20  1148     History Chief Complaint  Patient presents with   Hypertension    Candace Cummings is a 40 y.o. female.  Patient was feeling fine until this morning.  No known sick contacts.  Patient with a complaint of body aches weakness this morning no sore throat no fevers no nausea vomiting or diarrhea.  No shortness of breath.  No cough.  Patient was seen in the [facility] 8 May 9 with elevated blood pressure.  Blood pressure is elevated here today.  Patient not on blood pressure medicines.  Patient is unvaccinated.      Past Medical History:  Diagnosis Date   Asthma    Polycystic disease, ovaries     There are no problems to display for this patient.   Past Surgical History:  Procedure Laterality Date   ESOPHAGOGASTRODUODENOSCOPY (EGD) WITH PROPOFOL N/A 12/18/2018   Procedure: ESOPHAGOGASTRODUODENOSCOPY (EGD) WITH PROPOFOL;  Surgeon: Christena Deem, MD;  Location: [facility] ENDOSCOPY;  Service: Endoscopy;  Laterality: N/A;     OB History   No obstetric history on file.     Family History  Problem Relation Age of Onset   Healthy Mother    Healthy Father     Social History   Tobacco Use   Smoking status: Every Day    Packs/day: 1.00    Pack years: 0.00    Types: Cigarettes   Smokeless tobacco: Never  Vaping Use   Vaping Use: Never used  Substance Use Topics   Alcohol use: No   Drug use: No    Home Medications Prior to Admission medications   Medication Sig Start Date End Date Taking? Authorizing Provider  nirmatrelvir/ritonavir EUA (PAXLOVID) TABS Take 3 tablets by mouth 2 (two) times daily for 5 days. Patient GFR is 60. Take nirmatrelvir (150 mg) two tablets twice daily for 5 days and ritonavir (100 mg) one tablet twice daily for 5 days. 12/23/20 12/28/20 Yes Vanetta Mulders, MD  acetaminophen (TYLENOL) 325 MG tablet Take 650 mg by mouth every 6 (six)  hours as needed for moderate pain.    [provider]  cetirizine (ZYRTEC) 10 MG tablet Take 10 mg by mouth daily.    [provider]  clarithromycin (BIAXIN) 500 MG tablet Take 500 mg by mouth 2 (two) times daily.  11/02/18   [provider]  dicyclomine (BENTYL) 20 MG tablet Take 1 tablet (20 mg total) by mouth 2 (two) times daily as needed for spasms (abdominal cramping). 12/05/20   Mesner, Barbara Cower, MD  Multiple Vitamin (MULTIVITAMIN WITH MINERALS) TABS tablet Take 1 tablet by mouth daily.    [provider]  omeprazole (PRILOSEC) 20 MG capsule Take 20 mg by mouth 2 (two) times daily before a meal. 11/02/18   [provider]  pantoprazole (PROTONIX) 40 MG tablet Take 40 mg by mouth daily.    [provider]  predniSONE (STERAPRED UNI-PAK 21 TAB) 10 MG (21) TBPK tablet Take 6 tablets the first day, take 5 tablets the second day, take 4 tablets the third day, take 3 tablets the fourth day, take 2 tablets the fifth day, take 1 tablet the sixth day. 10/02/19   Orvil Feil, PA-C    Allergies    Doxycycline, Amoxicillin, Codeine, Penicillins, Penicillins cross reactors, Septra [bactrim], and Sulfa antibiotics  Review of Systems   Review of Systems  Constitutional:  Positive for  fatigue. Negative for chills and fever.  HENT:  Negative for ear pain and sore throat.   Eyes:  Negative for pain and visual disturbance.  Respiratory:  Negative for cough and shortness of breath.   Cardiovascular:  Negative for chest pain and palpitations.  Gastrointestinal:  Negative for abdominal pain and vomiting.  Genitourinary:  Negative for dysuria and hematuria.  Musculoskeletal:  Positive for myalgias. Negative for arthralgias and back pain.  Skin:  Negative for color change and rash.  Neurological:  Positive for weakness. Negative for seizures and syncope.  All other systems reviewed and are negative.  Physical Exam Updated Vital Signs BP (!) 157/98    Pulse (!) 107   Temp 98.3 F (36.8 C) (Oral)   Resp 16   Ht 1.727 m (5\' 8" )   Wt 117 kg   SpO2 97%   BMI 39.22 kg/m   Physical Exam Vitals and nursing note reviewed.  Constitutional:      General: She is not in acute distress.    Appearance: Normal appearance. She is well-developed. She is obese.  HENT:     Head: Normocephalic and atraumatic.  Eyes:     Extraocular Movements: Extraocular movements intact.     Conjunctiva/sclera: Conjunctivae normal.     Pupils: Pupils are equal, round, and reactive to light.  Cardiovascular:     Rate and Rhythm: Normal rate and regular rhythm.     Heart sounds: No murmur heard. Pulmonary:     Effort: Pulmonary effort is normal. No respiratory distress.     Breath sounds: Normal breath sounds. No wheezing.  Abdominal:     Palpations: Abdomen is soft.     Tenderness: There is no abdominal tenderness.  Musculoskeletal:        General: No swelling.     Cervical back: Normal range of motion and neck supple. No rigidity.  Skin:    General: Skin is warm and dry.  Neurological:     General: No focal deficit present.     Mental Status: She is alert and oriented to person, place, and time.     Cranial Nerves: No cranial nerve deficit.     Sensory: No sensory deficit.     Motor: No weakness.    ED Results / Procedures / Treatments   Labs (all labs ordered are listed, but only abnormal results are displayed) Labs Reviewed  RESP PANEL BY RT-PCR (FLU A&B, COVID) ARPGX2 - Abnormal; Notable for the following components:      Result Value   SARS Coronavirus 2 by RT PCR POSITIVE (*)    All other components within normal limits  BASIC METABOLIC PANEL - Abnormal; Notable for the following components:   Glucose, Bld 184 (*)    All other components within normal limits  CBC WITH DIFFERENTIAL/PLATELET - Abnormal; Notable for the following components:   WBC 12.1 (*)    Neutro Abs 10.5 (*)    Lymphs Abs 0.5 (*)    Abs Immature Granulocytes 0.11 (*)     All other components within normal limits  I-STAT BETA HCG BLOOD, ED ([facility], [facility], [facility] ONLY)    EKG None  Radiology No results found.  Procedures Procedures   Medications Ordered in ED Medications - No data to display  ED Course  I have reviewed the triage vital signs and the nursing notes.  Pertinent labs & imaging results that were available during my care of the patient were reviewed by me and considered in my medical decision  making (see chart for details).    MDM Rules/Calculators/A&P                          Patient's labs without significant abnormalities as far as electrolytes ago other than a blood sugar of 184.  CBC significant for mild leukocytosis with a white count of 12.1.  Pregnancy test negative.  COVID test is positive flu testing is negative.  Fits in with patient's presentation.  Patient has the history of hypertension and a history of obesity with a BMI of 39.22.  Patient meets criteria to be treated with Paxil bid.  Prescription provided precautions provided work note provided. Final Clinical Impression(s) / ED Diagnoses Final diagnoses:  Primary hypertension  COVID    Rx / DC Orders ED Discharge Orders          Ordered    nirmatrelvir/ritonavir EUA (PAXLOVID) TABS  2 times daily        12/23/20 1500             Vanetta Mulders, MD 12/23/20 [phone]

## 2020-12-23 NOTE — ED Notes (Signed)
ED Provider at bedside. 

## 2020-12-23 NOTE — ED Provider Notes (Signed)
Emergency Medicine Provider Triage Evaluation Note  Candace Cummings , a 40 y.o. female  was evaluated in triage.  Pt complains of gradual onset, constant, diffuse, body aches that began this morning upon waking up. Pt states she went to work and felt very lightheaded and had her blood pressure checked and states it was high. She also has had a cough however denies recent sick contacts. She is unvaccinated. Pt states she was seen in the ED 2 weeks ago and was told her BP was high. She does not have a PCP to follow up with.   Review of Systems  Positive: + body aches, cough, lightheaded, high blood pressure Negative: - chest pain, SOB  Physical Exam  BP (!) 163/106   Pulse (!) 102   Temp 98.5 F (36.9 C)   Resp 20   Ht 5\' 8"  (1.727 m)   Wt 117 kg   SpO2 99%   BMI 39.22 kg/m  Gen:   Awake, no distress   Resp:  Normal effort  MSK:   Moves extremities without difficulty  Other:    Medical Decision Making  Medically screening exam initiated at 12:35 PM.  Appropriate orders placed.  LORRA FREEMAN was informed that the remainder of the evaluation will be completed by another provider, this initial triage assessment does not replace that evaluation, and the importance of remaining in the ED until their evaluation is complete.     Roger Kill, PA-C 12/23/20 1238    12/25/20, MD 12/23/20 1404

## 2020-12-23 NOTE — ED Triage Notes (Signed)
Pt complaining of high BP, bodyaches and weakness upon waking this am.  Ems vitals: Bp:172/108 HR:100 R:20 Spo2:98% Temp 61F

## 2020-12-23 NOTE — Discharge Instructions (Signed)
Quarantine for 7 days.  Prescription provided for the Paxil over the antiviral take that as directed.  Otherwise symptomatic treatment.  To include things like Tylenol cough medicine flulike medicine.  Return for any new or worse symptoms.

## 2021-03-08 DIAGNOSIS — R059 Cough, unspecified: Secondary | ICD-10-CM | POA: Diagnosis not present

## 2021-03-08 DIAGNOSIS — J019 Acute sinusitis, unspecified: Secondary | ICD-10-CM | POA: Diagnosis not present

## 2021-03-18 DIAGNOSIS — J40 Bronchitis, not specified as acute or chronic: Secondary | ICD-10-CM | POA: Diagnosis not present

## 2021-03-18 DIAGNOSIS — R062 Wheezing: Secondary | ICD-10-CM | POA: Diagnosis not present

## 2021-05-12 DIAGNOSIS — J209 Acute bronchitis, unspecified: Secondary | ICD-10-CM | POA: Diagnosis not present

## 2021-05-12 DIAGNOSIS — J019 Acute sinusitis, unspecified: Secondary | ICD-10-CM | POA: Diagnosis not present

## 2021-05-12 DIAGNOSIS — Z03818 Encounter for observation for suspected exposure to other biological agents ruled out: Secondary | ICD-10-CM | POA: Diagnosis not present

## 2021-05-12 DIAGNOSIS — B9689 Other specified bacterial agents as the cause of diseases classified elsewhere: Secondary | ICD-10-CM | POA: Diagnosis not present

## 2021-05-20 ENCOUNTER — Emergency Department
Admission: EM | Admit: 2021-05-20 | Discharge: 2021-05-20 | Disposition: A | Discharge status: Home / Self Care | Payer: BC Managed Care – PPO | Attending: Emergency Medicine | Admitting: Emergency Medicine

## 2021-05-20 ENCOUNTER — Encounter: Payer: Self-pay | Admitting: Emergency Medicine

## 2021-05-20 ENCOUNTER — Other Ambulatory Visit: Payer: Self-pay

## 2021-05-20 DIAGNOSIS — F17210 Nicotine dependence, cigarettes, uncomplicated: Secondary | ICD-10-CM | POA: Diagnosis not present

## 2021-05-20 DIAGNOSIS — J45909 Unspecified asthma, uncomplicated: Secondary | ICD-10-CM | POA: Insufficient documentation

## 2021-05-20 DIAGNOSIS — M545 Low back pain, unspecified: Secondary | ICD-10-CM | POA: Diagnosis not present

## 2021-05-20 DIAGNOSIS — Z79899 Other long term (current) drug therapy: Secondary | ICD-10-CM | POA: Diagnosis not present

## 2021-05-20 DIAGNOSIS — M5441 Lumbago with sciatica, right side: Secondary | ICD-10-CM | POA: Diagnosis not present

## 2021-05-20 MED ORDER — PREDNISONE 20 MG PO TABS
60.0000 mg | ORAL_TABLET | Freq: Once | ORAL | Status: AC
  Administered 2021-05-20: 60 mg via ORAL
  Filled 2021-05-20: qty 3

## 2021-05-20 MED ORDER — LIDOCAINE 5 % EX PTCH
1.0000 | MEDICATED_PATCH | Freq: Once | CUTANEOUS | Status: DC
  Administered 2021-05-20: 1 via TRANSDERMAL
  Filled 2021-05-20: qty 1

## 2021-05-20 MED ORDER — LIDOCAINE 5 % EX PTCH: 1.0000 | MEDICATED_PATCH | Freq: Two times a day (BID) | CUTANEOUS | 0 refills | Status: AC | PRN

## 2021-05-20 MED ORDER — PREDNISONE 20 MG PO TABS: 40.0000 mg | ORAL_TABLET | Freq: Every day | ORAL | 0 refills | Status: AC

## 2021-05-20 MED ORDER — CYCLOBENZAPRINE HCL 5 MG PO TABS: 5.0000 mg | ORAL_TABLET | Freq: Three times a day (TID) | ORAL | 0 refills | Status: DC | PRN

## 2021-05-20 MED ORDER — CYCLOBENZAPRINE HCL 10 MG PO TABS
10.0000 mg | ORAL_TABLET | Freq: Once | ORAL | Status: AC
  Administered 2021-05-20: 10 mg via ORAL
  Filled 2021-05-20: qty 1

## 2021-05-20 NOTE — Discharge Instructions (Addendum)
Your exam is consistent with lumbar strain.  Take the prescription meds as directed.  Follow-up with your primary provider for ongoing symptoms peer return to the ED if needed.

## 2021-05-20 NOTE — ED Provider Notes (Signed)
[facility] Emergency Department Provider Note ____________________________________________  Time seen: 1628  I have reviewed the triage vital signs and the nursing notes.  HISTORY  Chief Complaint  Back Pain   HPI Candace Cummings is a 40 y.o. female presents her self to the ED with 2 to 3 days of low back pain.  Patient describes pain to the right lumbar sacral junction.  She denies any referral of pain down the right lower extremity consistent with her previous history of sciatica.  Denies any bladder or bowel incontinence, foot drop, saddle anesthesia.  She also denies any recent injury, trauma, or falls.  Past Medical History:  Diagnosis Date   Asthma    Polycystic disease, ovaries     There are no problems to display for this patient.   Past Surgical History:  Procedure Laterality Date   ESOPHAGOGASTRODUODENOSCOPY (EGD) WITH PROPOFOL N/A 12/18/2018   Procedure: ESOPHAGOGASTRODUODENOSCOPY (EGD) WITH PROPOFOL;  Surgeon: Christena Deem, MD;  Location: [facility] ENDOSCOPY;  Service: Endoscopy;  Laterality: N/A;    Prior to Admission medications   Medication Sig Start Date End Date Taking? Authorizing Provider  acetaminophen (TYLENOL) 325 MG tablet Take 650 mg by mouth every 6 (six) hours as needed for moderate pain.    [provider]  albuterol (VENTOLIN HFA) 108 (90 Base) MCG/ACT inhaler Inhale 1-2 puffs into the lungs every 6 (six) hours as needed for wheezing or shortness of breath. 12/23/20   Vanetta Mulders, MD  cetirizine (ZYRTEC) 10 MG tablet Take 10 mg by mouth daily.    [provider]  clarithromycin (BIAXIN) 500 MG tablet Take 500 mg by mouth 2 (two) times daily.  11/02/18   [provider]  dicyclomine (BENTYL) 20 MG tablet Take 1 tablet (20 mg total) by mouth 2 (two) times daily as needed for spasms (abdominal cramping). 12/05/20   Mesner, Barbara Cower, MD  Multiple Vitamin (MULTIVITAMIN WITH MINERALS) TABS tablet Take 1 tablet  by mouth daily.    [provider]  omeprazole (PRILOSEC) 20 MG capsule Take 20 mg by mouth 2 (two) times daily before a meal. 11/02/18   [provider]  pantoprazole (PROTONIX) 40 MG tablet Take 40 mg by mouth daily.    [provider]  predniSONE (STERAPRED UNI-PAK 21 TAB) 10 MG (21) TBPK tablet Take 6 tablets the first day, take 5 tablets the second day, take 4 tablets the third day, take 3 tablets the fourth day, take 2 tablets the fifth day, take 1 tablet the sixth day. 10/02/19   Orvil Feil, PA-C    Allergies Doxycycline, Amoxicillin, Codeine, Penicillins, Penicillins cross reactors, Septra [bactrim], and Sulfa antibiotics  Family History  Problem Relation Age of Onset   Healthy Mother    Healthy Father     Social History Social History   Tobacco Use   Smoking status: Every Day    Packs/day: 1.00    Types: Cigarettes   Smokeless tobacco: Never  Vaping Use   Vaping Use: Never used  Substance Use Topics   Alcohol use: No   Drug use: No    Review of Systems  Constitutional: Negative for fever. Eyes: Negative for visual changes. ENT: Negative for sore throat. Cardiovascular: Negative for chest pain. Respiratory: Negative for shortness of breath. Gastrointestinal: Negative for abdominal pain, vomiting and diarrhea. Genitourinary: Negative for dysuria. Musculoskeletal: Positive for back pain. Skin: Negative for rash. Neurological: Negative for headaches, focal weakness or numbness. ____________________________________________  PHYSICAL EXAM:  VITAL SIGNS:  ED Triage Vitals [05/20/21 1535]  Enc Vitals Group     BP (!) 138/94     Pulse Rate 82     Resp 16     Temp 98 F (36.7 C)     Temp Source Oral     SpO2 96 %     Weight 257 lb 15 oz (117 kg)     Height 5\' 8"  (1.727 m)     Head Circumference      Peak Flow      Pain Score 9     Pain Loc      Pain Edu?      Excl. in GC?     Constitutional: Alert and oriented. Well  appearing and in no distress. Head: Normocephalic and atraumatic. Eyes: Conjunctivae are normal. Normal extraocular movements Cardiovascular: Normal rate, regular rhythm. Normal distal pulses. Respiratory: Normal respiratory effort. No wheezes/rales/rhonchi. Gastrointestinal: Soft and nontender. No distention. Musculoskeletal: Normal spinal alignment without midline tenderness, spasm, deformity, or step-off.  Patient tender to palpation to the right lumbar sacral junction at the SI joint.  Nontender with normal range of motion in all extremities.  Normal transition from sit to standing. Neurologic: Cranial nerves II through XII grossly intact.  Normal LE DTRs bilaterally.  Normal toe raise and heel raise on exam.  Normal gait without ataxia. Normal speech and language. No gross focal neurologic deficits are appreciated. Skin:  Skin is warm, dry and intact. No rash noted. Psychiatric: Mood and affect are normal. Patient exhibits appropriate insight and judgment. ____________________________________________    {LABS (pertinent positives/negatives)  ____________________________________________  {EKG  ____________________________________________   RADIOLOGY Official radiology report(s): No results found. ____________________________________________  PROCEDURES  Prednisone 60 mg PO Cyclobenzaprine 10 mg p.o. Lidoderm patch 5%  Procedures ____________________________________________   INITIAL IMPRESSION / ASSESSMENT AND PLAN / ED COURSE  As part of my medical decision making, I reviewed the following data within the electronic MEDICAL RECORD NUMBER Notes from prior ED visits and [place] Controlled Substance Database   DDX: lumbar strain, sciatica, kidney stones  Patient ED evaluation of right-sided SI joint pain without preceding injury, trauma, fall patient presents to the ED with 2 to 3 days of increasing symptoms are aggravated by changing positions patient denies any bladder or bowel  incontinence.  No red flags on exam.  Patient clinical picture is consistent with lumbar sacral strain.  She be placed on anti-inflammatories, steroids, and Lidoderm patches.  She is advised to follow with primary provider for ongoing symptoms.  Return precautions of been reviewed.  A work note is provided for the next 2 to 3 days as requested.   Candace Cummings was evaluated in Emergency Department on 05/20/2021 for the symptoms described in the history of present illness. She was evaluated in the context of the global COVID-19 pandemic, which necessitated consideration that the patient might be at risk for infection with the SARS-CoV-2 virus that causes COVID-19. Institutional protocols and algorithms that pertain to the evaluation of patients at risk for COVID-19 are in a state of rapid change based on information released by regulatory bodies including the CDC and federal and state organizations. These policies and algorithms were followed during the patient's care in the ED. ____________________________________________  FINAL CLINICAL IMPRESSION(S) / ED DIAGNOSES  Final diagnoses:  Acute right-sided low back pain with right-sided sciatica      14/01/2021, PA-C 05/20/21 1656    14/08/22, MD 05/20/21 1859

## 2021-05-20 NOTE — ED Triage Notes (Signed)
Pt comes into the ED via POV c/on right lower back pain that is causing pain and weakness in the right leg at times based on how she moves.  H/o sciatica.  Pt denies any urinary symptoms.  Pt able to ambulate to triage with NAd.

## 2021-07-19 DIAGNOSIS — Z03818 Encounter for observation for suspected exposure to other biological agents ruled out: Secondary | ICD-10-CM | POA: Diagnosis not present

## 2021-07-19 DIAGNOSIS — R6883 Chills (without fever): Secondary | ICD-10-CM | POA: Diagnosis not present

## 2021-07-19 DIAGNOSIS — J0140 Acute pansinusitis, unspecified: Secondary | ICD-10-CM | POA: Diagnosis not present

## 2021-12-20 DIAGNOSIS — W010XXA Fall on same level from slipping, tripping and stumbling without subsequent striking against object, initial encounter: Secondary | ICD-10-CM | POA: Diagnosis not present

## 2021-12-20 DIAGNOSIS — S99922A Unspecified injury of left foot, initial encounter: Secondary | ICD-10-CM | POA: Diagnosis not present

## 2021-12-20 DIAGNOSIS — S8992XA Unspecified injury of left lower leg, initial encounter: Secondary | ICD-10-CM | POA: Diagnosis not present

## 2021-12-20 DIAGNOSIS — S99912A Unspecified injury of left ankle, initial encounter: Secondary | ICD-10-CM | POA: Diagnosis not present

## 2022-04-01 DIAGNOSIS — J209 Acute bronchitis, unspecified: Secondary | ICD-10-CM | POA: Diagnosis not present

## 2022-04-01 DIAGNOSIS — R059 Cough, unspecified: Secondary | ICD-10-CM | POA: Diagnosis not present

## 2022-04-01 DIAGNOSIS — R6883 Chills (without fever): Secondary | ICD-10-CM | POA: Diagnosis not present

## 2022-04-01 DIAGNOSIS — R0981 Nasal congestion: Secondary | ICD-10-CM | POA: Diagnosis not present

## 2022-04-01 DIAGNOSIS — J019 Acute sinusitis, unspecified: Secondary | ICD-10-CM | POA: Diagnosis not present

## 2022-04-01 DIAGNOSIS — U071 COVID-19: Secondary | ICD-10-CM | POA: Diagnosis not present

## 2022-04-05 DIAGNOSIS — U071 COVID-19: Secondary | ICD-10-CM | POA: Diagnosis not present

## 2022-04-05 DIAGNOSIS — H66002 Acute suppurative otitis media without spontaneous rupture of ear drum, left ear: Secondary | ICD-10-CM | POA: Diagnosis not present

## 2022-04-05 DIAGNOSIS — R059 Cough, unspecified: Secondary | ICD-10-CM | POA: Diagnosis not present

## 2022-04-05 DIAGNOSIS — J329 Chronic sinusitis, unspecified: Secondary | ICD-10-CM | POA: Diagnosis not present

## 2022-07-15 DIAGNOSIS — J0141 Acute recurrent pansinusitis: Secondary | ICD-10-CM | POA: Diagnosis not present

## 2023-11-29 ENCOUNTER — Other Ambulatory Visit: Payer: Self-pay | Admitting: Family Medicine

## 2023-11-29 DIAGNOSIS — M5416 Radiculopathy, lumbar region: Secondary | ICD-10-CM

## 2023-12-01 ENCOUNTER — Telehealth: Payer: Self-pay | Admitting: Pharmacist

## 2023-12-04 NOTE — Progress Notes (Signed)
   12/04/2023  Patient ID: Candace Cummings, female   DOB: 1980/12/30, 43 y.o.   MRN: [id number]  Received DM referral from PCP Dr. Ricky for management. Recent A1c of 12%. Requested assistance with getting readings under control.   Called and spoke with the patient on the phone today while she was at worked. Scheduled timeframe to call and discuss DM on Thursday at [facility]. Reports having her Herlene and meds started, so will review information further then.    Aloysius Lewis, PharmD [facility]  Phone Number: [phone]

## 2023-12-07 ENCOUNTER — Telehealth: Payer: Self-pay | Admitting: Pharmacist

## 2023-12-07 NOTE — Progress Notes (Signed)
 12/07/2023 Name: Candace Cummings MRN: [id number] DOB: Oct 26, 1980  Chief Complaint  Patient presents with   Diabetes    Candace Cummings is a 43 y.o. year old female who presented for a telephone visit.   They were referred to the pharmacist by their PCP for assistance in managing diabetes.   Started DM treatment on 11/29/23   Subjective:  Care Team: Primary Care Provider: [facility] ; Next Scheduled Visit: 12/11/23 Clinical Pharmacist: Aloysius Lewis, PharmD  Medication Access/Adherence  Current Pharmacy:  [facility] [address] [place] - [address] [place] [address] Phone: [phone] Fax: [phone]   Patient reports affordability concerns with their medications: No  Patient reports access/transportation concerns to their pharmacy: No  Patient reports adherence concerns with their medications:  No     Diabetes:  Current medications: Lantus 10U at bedtime, Metformin ER 500mg - 1 tab twice a day Medications tried in the past: None   Date of Readings: 12/07/23 (using Libre reader) Avg glucose: 242 247, 247, 236, 238  Above: 100% In target: 0% Low: 0% (Always above 180)  Was 198 at 2:15PM- last ate eggs at 10AM  Patient denies hypoglycemic s/sx including dizziness, shakiness, sweating. Patient denies hyperglycemic symptoms including polyuria, polydipsia, polyphagia, nocturia, neuropathy, blurred vision.  Diet: Trying to eat more vegetables More salads + proteins Avoiding carbs Water only, zero sugar ginger ale as needed  Current medication access support: [employer]BS Commercial  11/29/23: A1c 12.0%   Objective:  No results found for: HGBA1C  Lab Results  Component Value Date   CREATININE 0.60 12/23/2020   BUN 10 12/23/2020   NA 136 12/23/2020   K 4.1 12/23/2020   CL 102 12/23/2020   CO2 24 12/23/2020    No results found for: CHOL, HDL, LDLCALC, LDLDIRECT, TRIG, CHOLHDL  Medications  Reviewed Today     Reviewed by Lewis Aloysius CHRISTELLA, [facility] (Pharmacist) on 12/07/23 at 1428  Med List Status: <None>   Medication Order Taking? Sig Documenting Provider Last Dose Status Informant  acetaminophen  (TYLENOL ) 325 MG tablet [id number] Yes Take 650 mg by mouth every 6 (six) hours as needed for moderate pain. [provider]  Active Self  albuterol  (VENTOLIN  HFA) 108 (90 Base) MCG/ACT inhaler [id number] Yes Inhale 1-2 puffs into the lungs every 6 (six) hours as needed for wheezing or shortness of breath. Zackowski, Scott, MD  Active   cetirizine (ZYRTEC) 10 MG tablet [id number] Yes Take 10 mg by mouth daily. [provider]  Active Self    Discontinued 12/07/23 1427 (Completed Course) Continuous Glucose Receiver (FREESTYLE LIBRE 3 READER) [place] [id number] Yes as directed. [provider]  Active   Continuous Glucose Sensor (FREESTYLE LIBRE 3 PLUS SENSOR) [place] [id number] Yes SMARTSIG:Every 3 Days [provider]  Active     Discontinued 12/07/23 1427 (Completed Course)   dicyclomine  (BENTYL ) 20 MG tablet [id number] Yes Take 1 tablet (20 mg total) by mouth 2 (two) times daily as needed for spasms (abdominal cramping). Mesner, Selinda, MD  Active   LANTUS SOLOSTAR 100 UNIT/ML Solostar Pen [id number] Yes Inject 10 Units into the skin at bedtime. [provider]  Active   metFORMIN (GLUCOPHAGE-XR) 500 MG 24 hr tablet [id number] Yes Take 500 mg by mouth 2 (two) times daily. [provider]  Active   Multiple Vitamin (MULTIVITAMIN WITH MINERALS) TABS tablet [id number] Yes Take 1 tablet by mouth daily. [provider]  Active Self  omeprazole (  PRILOSEC) 20 MG capsule [id number] Yes Take 20 mg by mouth 2 (two) times daily before a meal. [provider]  Active Self     Discontinued 12/07/23 1427 (Completed Course)     Discontinued 12/07/23 1427 (Completed Course)               Assessment/Plan:   Diabetes: - Currently uncontrolled -  Reviewed long term cardiovascular and renal outcomes of uncontrolled blood sugar - Reviewed goal A1c, goal fasting, and goal 2 hour post prandial glucose - Reviewed dietary modifications including minimizing carbs - Recommend to check glucose continuously with Herlene reader    Follow Up Plan:  - Follow-up call on 12/14/23 at [facility] for [place] reader info (should show weekly info) - Increase Lantus to 18U nightly - Took a while to get used to Metformin with GI upset- consider 2 months of treatment before another increase   Aloysius Lewis, PharmD [facility]  Phone Number: [phone]

## 2023-12-08 ENCOUNTER — Encounter: Payer: Self-pay | Admitting: Family Medicine

## 2023-12-11 ENCOUNTER — Inpatient Hospital Stay
Admission: RE | Admit: 2023-12-11 | Discharge: 2023-12-11 | Discharge status: Home / Self Care | Source: Ambulatory Visit | Attending: Family Medicine

## 2023-12-11 DIAGNOSIS — M5416 Radiculopathy, lumbar region: Secondary | ICD-10-CM

## 2023-12-14 ENCOUNTER — Telehealth: Payer: Self-pay | Admitting: Pharmacist

## 2023-12-14 NOTE — Progress Notes (Signed)
   12/14/2023  Patient ID: Ginger CHRISTELLA Bathe, female   DOB: 1981-06-07, 43 y.o.   MRN: [id number]  Called and spoke with the patient on the phone briefly as she just arrived at work.   Reports on 6/30 the Lantus was increased to 23U nightly and metformin slowly increased by additional tablet to 2 in AM + 1 in PM. Doing slow titration due to GI upset.  Patient has been checking fingersticks as her Libre readings are 20 points higher than the fingersticks. Advised that is to be expected as the CGM lags behind and is usually about 30 points off from her fingersticks, but that fingersticks are more accurate to follow.   Will follow-up on 7/10 at 10AM.   Aloysius Lewis, PharmD [facility]  Phone Number: [phone]

## 2023-12-20 ENCOUNTER — Telehealth: Payer: Self-pay | Admitting: Pharmacist

## 2023-12-20 NOTE — Progress Notes (Signed)
   12/20/2023  Patient ID: Candace Cummings, female   DOB: 07-30-1980, 43 y.o.   MRN: [id number]  Called and spoke with the patient on the phone today. She was unfortunately at work during our call today so it had to be brief again.  Continued frustrations with the Herlene being about 20-30 points higher than the fingerstick readings. Does fingersticks occasionally and has number written down at home- unable to get today.  Due to frequent shift schedule changes, there is no consistency with eating patterns and therefore inconsistency with BG changes. Said her BG increases over night and is high in the morning, which means nighttime insulin dose is appropriate.   Libre readings are as follows: Average: 216 Above: 82% In target: 18% Below: 0%   Plan: - Increase Lantus to 26U nightly - Monitor for lowest readings of the day- this will decide maximum insulin dose; bring to next visit with Dr. Ricky on 7/21 - Reports taking Metformin max dose now - Follow-up on 8/4 to see how readings look   Aloysius Lewis, PharmD [place]  Phone Number: [phone]

## 2024-01-15 ENCOUNTER — Telehealth: Payer: Self-pay | Admitting: Pharmacist

## 2024-01-15 NOTE — Progress Notes (Addendum)
   01/15/2024  Patient ID: Candace Cummings, female   DOB: [birth year], 43 y.o.   MRN: [id number]  Attempted to contact patient for follow-up call regarding diabetes management. Left HIPAA compliant message for patient to return my call at their convenience.    Will try again in 1 week.   Update from 8/11: Called and left voicemail requesting call back at earliest convenience. Attempt #2. Will try again in 1 week.    Aloysius Lewis, PharmD [facility]  Phone Number: [phone]

## 2024-02-28 ENCOUNTER — Telehealth: Payer: Self-pay | Admitting: Pharmacist

## 2024-02-28 NOTE — Progress Notes (Signed)
   02/28/2024  Patient ID: Candace Cummings, female   DOB: 06/02/1981, 43 y.o.   MRN: [id number]  Signa New 02/14/2024 01:12:02 PM > Called and left voicemail requesting call back at earliest convenience. Appears patient did not attend last PCP visit in August. Called to follow-up on DM concerns with insulin adjustment. If patient returns my call, she can reach my direct line at [phone].  Signa, [place] 02/21/2024 11:46:18 AM > Called and left voicemail requesting call back once again. Attempt #2.  Signa, [place] 02/28/2024 01:35:49 PM > Called and left voicemail requesting call back once again. Attempt #3. Patient will need an updated insulin script based on her insulin dosing prior to the next appointment- should have ~1 month left.  Unable to reach the patient.     New Signa, PharmD [facility]  Phone Number: [phone]

## 2024-05-01 ENCOUNTER — Other Ambulatory Visit: Payer: Self-pay | Admitting: Orthopedic Surgery

## 2024-05-01 DIAGNOSIS — M50122 Cervical disc disorder at C5-C6 level with radiculopathy: Secondary | ICD-10-CM

## 2024-05-01 DIAGNOSIS — M50123 Cervical disc disorder at C6-C7 level with radiculopathy: Secondary | ICD-10-CM

## 2024-05-01 DIAGNOSIS — M50121 Cervical disc disorder at C4-C5 level with radiculopathy: Secondary | ICD-10-CM

## 2024-05-17 ENCOUNTER — Inpatient Hospital Stay
Admission: RE | Admit: 2024-05-17 | Discharge: 2024-05-17 | Attending: Orthopedic Surgery | Admitting: Orthopedic Surgery

## 2024-05-17 DIAGNOSIS — M50122 Cervical disc disorder at C5-C6 level with radiculopathy: Secondary | ICD-10-CM

## 2024-05-17 DIAGNOSIS — M50121 Cervical disc disorder at C4-C5 level with radiculopathy: Secondary | ICD-10-CM

## 2024-05-17 DIAGNOSIS — M50123 Cervical disc disorder at C6-C7 level with radiculopathy: Secondary | ICD-10-CM
